# Patient Record
Sex: Female | Born: 1970
Health system: Southern US, Community
[De-identification: ages and names within clinical notes are randomized; demographics above are authoritative.]

## PROBLEM LIST (undated history)

## (undated) DIAGNOSIS — F419 Anxiety disorder, unspecified: Secondary | ICD-10-CM

## (undated) DIAGNOSIS — M1990 Unspecified osteoarthritis, unspecified site: Secondary | ICD-10-CM

## (undated) DIAGNOSIS — I10 Essential (primary) hypertension: Secondary | ICD-10-CM

## (undated) DIAGNOSIS — D45 Polycythemia vera: Secondary | ICD-10-CM

## (undated) HISTORY — DX: Anxiety disorder, unspecified: F41.9

## (undated) HISTORY — PX: BREAST SURGERY: SHX581

## (undated) HISTORY — DX: Unspecified osteoarthritis, unspecified site: M19.90

---

## 2015-12-25 ENCOUNTER — Encounter (HOSPITAL_BASED_OUTPATIENT_CLINIC_OR_DEPARTMENT_OTHER): Payer: Self-pay | Admitting: *Deleted

## 2015-12-25 ENCOUNTER — Emergency Department (HOSPITAL_BASED_OUTPATIENT_CLINIC_OR_DEPARTMENT_OTHER)
Admission: EM | Admit: 2015-12-25 | Discharge: 2015-12-25 | Disposition: A | Discharge status: Home / Self Care | Payer: Self-pay | Attending: Emergency Medicine | Admitting: Emergency Medicine

## 2015-12-25 ENCOUNTER — Emergency Department (HOSPITAL_BASED_OUTPATIENT_CLINIC_OR_DEPARTMENT_OTHER): Payer: Self-pay

## 2015-12-25 DIAGNOSIS — I10 Essential (primary) hypertension: Secondary | ICD-10-CM | POA: Insufficient documentation

## 2015-12-25 HISTORY — DX: Polycythemia vera: D45

## 2015-12-25 HISTORY — DX: Essential (primary) hypertension: I10

## 2015-12-25 LAB — COMPREHENSIVE METABOLIC PANEL
ALBUMIN: 4.7 g/dL (ref 3.5–5.0)
ALK PHOS: 70 U/L (ref 38–126)
ALT: 30 U/L (ref 14–54)
ANION GAP: 14 (ref 5–15)
AST: 28 U/L (ref 15–41)
BUN: 9 mg/dL (ref 6–20)
CALCIUM: 9.6 mg/dL (ref 8.9–10.3)
CO2: 24 mmol/L (ref 22–32)
Chloride: 98 mmol/L — ABNORMAL LOW (ref 101–111)
Creatinine, Ser: 0.65 mg/dL (ref 0.44–1.00)
GFR calc non Af Amer: >60 mL/min (ref >60)
Glucose, Bld: 117 mg/dL — ABNORMAL HIGH (ref 65–99)
Potassium: 3.6 mmol/L (ref 3.5–5.1)
SODIUM: 136 mmol/L (ref 135–145)
TOTAL PROTEIN: 7.9 g/dL (ref 6.5–8.1)
Total Bilirubin: 1 mg/dL (ref 0.3–1.2)

## 2015-12-25 LAB — CBC
HCT: 46.4 % — ABNORMAL HIGH (ref 36.0–46.0)
HEMOGLOBIN: 16.4 g/dL — AB (ref 12.0–15.0)
MCH: 32.2 pg (ref 26.0–34.0)
MCHC: 35.3 g/dL (ref 30.0–36.0)
MCV: 91.2 fL (ref 78.0–100.0)
Platelets: 335 10*3/uL (ref 150–400)
RBC: 5.09 MIL/uL (ref 3.87–5.11)
RDW: 12.5 % (ref 11.5–15.5)
WBC: 10.1 10*3/uL (ref 4.0–10.5)

## 2015-12-25 LAB — TROPONIN I
TROPONIN I: 0.06 ng/mL — AB (ref <0.031)
Troponin I: <0.03 ng/mL (ref <0.031)

## 2015-12-25 MED ORDER — LISINOPRIL 10 MG PO TABS: 10.0000 mg | ORAL_TABLET | Freq: Every day | ORAL | Status: DC

## 2015-12-25 MED FILL — LISINOPRIL 10 MG TABLET: 10 | 14 days supply | Qty: 14 | Fill #0

## 2015-12-25 NOTE — Discharge Instructions (Signed)
Hypertension Hypertension, commonly called high blood pressure, is when the force of blood pumping through your arteries is too strong. Your arteries are the blood vessels that carry blood from your heart throughout your body. A blood pressure reading consists of a higher number over a lower number, such as 110/72. The higher number (systolic) is the pressure inside your arteries when your heart pumps. The lower number (diastolic) is the pressure inside your arteries when your heart relaxes. Ideally you want your blood pressure below 120/80. Hypertension forces your heart to work harder to pump blood. Your arteries may become narrow or stiff. Having untreated or uncontrolled hypertension can cause heart attack, stroke, kidney disease, and other problems. RISK FACTORS Some risk factors for high blood pressure are controllable. Others are not.  Risk factors you cannot control include:   Race. You may be at higher risk if you are African American.  Age. Risk increases with age.  Gender. Men are at higher risk than women before age 45 years. After age 65, women are at higher risk than men. Risk factors you can control include:  Not getting enough exercise or physical activity.  Being overweight.  Getting too much fat, sugar, calories, or salt in your diet.  Drinking too much alcohol. SIGNS AND SYMPTOMS Hypertension does not usually cause signs or symptoms. Extremely high blood pressure (hypertensive crisis) may cause headache, anxiety, shortness of breath, and nosebleed. DIAGNOSIS To check if you have hypertension, your health care provider will measure your blood pressure while you are seated, with your arm held at the level of your heart. It should be measured at least twice using the same arm. Certain conditions can cause a difference in blood pressure between your right and left arms. A blood pressure reading that is higher than normal on one occasion does not mean that you need treatment. If  it is not clear whether you have high blood pressure, you may be asked to return on a different day to have your blood pressure checked again. Or, you may be asked to monitor your blood pressure at home for 1 or more weeks. TREATMENT Treating high blood pressure includes making lifestyle changes and possibly taking medicine. Living a healthy lifestyle can help lower high blood pressure. You may need to change some of your habits. Lifestyle changes may include:  Following the DASH diet. This diet is high in fruits, vegetables, and whole grains. It is low in salt, red meat, and added sugars.  Keep your sodium intake below 2,300 mg per day.  Getting at least 30-45 minutes of aerobic exercise at least 4 times per week.  Losing weight if necessary.  Not smoking.  Limiting alcoholic beverages.  Learning ways to reduce stress. Your health care provider may prescribe medicine if lifestyle changes are not enough to get your blood pressure under control, and if one of the following is true:  You are 18-59 years of age and your systolic blood pressure is above 140.  You are 60 years of age or older, and your systolic blood pressure is above 150.  Your diastolic blood pressure is above 90.  You have diabetes, and your systolic blood pressure is over 140 or your diastolic blood pressure is over 90.  You have kidney disease and your blood pressure is above 140/90.  You have heart disease and your blood pressure is above 140/90. Your personal target blood pressure may vary depending on your medical conditions, your age, and other factors. HOME CARE INSTRUCTIONS    Have your blood pressure rechecked as directed by your health care provider.   Take medicines only as directed by your health care provider. Follow the directions carefully. Blood pressure medicines must be taken as prescribed. The medicine does not work as well when you skip doses. Skipping doses also puts you at risk for  problems.  Do not smoke.   Monitor your blood pressure at home as directed by your health care provider. SEEK MEDICAL CARE IF:   You think you are having a reaction to medicines taken.  You have recurrent headaches or feel dizzy.  You have swelling in your ankles.  You have trouble with your vision. SEEK IMMEDIATE MEDICAL CARE IF:  You develop a severe headache or confusion.  You have unusual weakness, numbness, or feel faint.  You have severe chest or abdominal pain.  You vomit repeatedly.  You have trouble breathing. MAKE SURE YOU:   Understand these instructions.  Will watch your condition.  Will get help right away if you are not doing well or get worse.   This information is not intended to replace advice given to you by your health care provider. Make sure you discuss any questions you have with your health care provider.   Document Released: 07/08/2005 Document Revised: 11/22/2014 Document Reviewed: 04/30/2013 Elsevier Interactive Patient Education 2016 Else[employer] Inc.  

## 2015-12-25 NOTE — ED Notes (Signed)
C/o eye twitching x 2 weeks. C/o high blood pressure and feels pressure and tingling in left arm and pressure in  Left upper chest. Pt c/o anxiety and probems with recent move. Pt is very anxious. Pt c/o h/a frontal.

## 2015-12-25 NOTE — ED Provider Notes (Signed)
CSN: [id number]     Arrival date & time 12/25/15  1135 History   First MD Initiated Contact with Patient 12/25/15 1211     Chief Complaint  Patient presents with  . Hypertension   HPI Pt has a history of having high blood pressure over the past few years.  She does not have a primary care doctor and has not had it evaluated further.  Over the last several days she has had some intermittent chest pain.  She thought it may be related to stress over moving to a new apartment.  A neighbor who reportedly has some mental health issues is causing a lot of problems.  The police have been involved.  The discomfort is vague in the left chest.  Constant last day or two. She has some tingling in the left arm too.  The sx get worse after the stress from the neighbor.    She has managed to lose weight , almost 50 lbs in the last year.  She exercises regularly and uses a gym.  She does not have chest pain with exercise. Past Medical History  Diagnosis Date  . Hypertension   . Polycythemia vera ([place])    History reviewed. No pertinent past surgical history. No family history on file. Social History  Substance Use Topics  . Smoking status: Never Smoker   . Smokeless tobacco: None  . Alcohol Use: None   OB History    No data available     Review of Systems  All other systems reviewed and are negative.     Allergies  Penicillins  Home Medications   Prior to Admission medications   Not on File   BP 147/101 mmHg  Pulse 78  Temp(Src) 98.2 F (36.8 C) (Oral)  Resp 23  Ht 5' 4.5" (1.638 m)  Wt 72.576 kg  BMI 27.05 kg/m2  SpO2 95% Physical Exam  Constitutional: No distress.  HENT:  Head: Normocephalic and atraumatic.  Right Ear: External ear normal.  Left Ear: External ear normal.  Eyes: Conjunctivae are normal. Right eye exhibits no discharge. Left eye exhibits no discharge. No scleral icterus.  Neck: Neck supple. No tracheal deviation present.  Cardiovascular: Normal rate, regular  rhythm and intact distal pulses.   Pulmonary/Chest: Effort normal and breath sounds normal. No stridor. No respiratory distress. She has no wheezes. She has no rales.  Abdominal: Soft. Bowel sounds are normal. She exhibits no distension. There is no tenderness. There is no rebound and no guarding.  Musculoskeletal: She exhibits no edema or tenderness.  Neurological: She is alert. She has normal strength. No cranial nerve deficit (no facial droop, extraocular movements intact, no slurred speech) or sensory deficit. She exhibits normal muscle tone. She displays no seizure activity. Coordination normal.  Skin: Skin is warm and dry. No rash noted.  Psychiatric: She has a normal mood and affect.  Nursing note and vitals reviewed.   ED Course  Procedures (including critical care time) Labs Review Labs Reviewed  CBC - Abnormal; Notable for the following:    Hemoglobin 16.4 (*)    HCT 46.4 (*)    All other components within normal limits  COMPREHENSIVE METABOLIC PANEL - Abnormal; Notable for the following:    Chloride 98 (*)    Glucose, Bld 117 (*)    All other components within normal limits  TROPONIN I - Abnormal; Notable for the following:    Troponin I 0.06 (*)    All other components within normal limits  TROPONIN I    Imaging Review Dg Chest 2 View  12/25/2015  CLINICAL DATA:  Left chest pain for 2 days.  Hypertension. EXAM: CHEST  2 VIEW COMPARISON:  None. FINDINGS: Heart and mediastinal contours are within normal limits. No focal opacities or effusions. No acute bony abnormality. IMPRESSION: No active cardiopulmonary disease. Electronically Signed   By: Rolm Baptise M.D.   On: 12/25/2015 12:55   I have personally reviewed and evaluated these images and lab results as part of my medical decision-making.   EKG Interpretation   Date/Time:  Monday December 25 2015 11:48:13 EDT Ventricular Rate:  89 PR Interval:  141 QRS Duration: 87 QT Interval:  368 QTC Calculation: 448 R Axis:    20 Text Interpretation:  Sinus rhythm Baseline wander in lead(s) V5 No  previous tracing Confirmed by Lucill Mauck  MD-J, Ravenna Legore ([id number]) on 12/25/2015  11:58:26 AM      MDM   Final diagnoses:  Essential hypertension    1343  Initial heart enzyme slightly elevated.  Will repeat in 1 hr.   1531  Repeat trop is normal.  Doubt that first enzyme was accurate.  Heart score of 1 for family history of heart disease in her mother.  Low risk for ACS.  Overall doubt cardiac etiology. Will dc home on an ace inhibitor. Follow up with a primary care doctor.  Discussed importance of management of her blood pressure.    Dorie Rank, MD 12/25/15 1534

## 2015-12-25 NOTE — ED Notes (Signed)
Dr. Tomi Bamberger notified of pt's troponin 0.06

## 2015-12-25 NOTE — ED Notes (Signed)
MD at bedside. 

## 2016-03-20 MED FILL — LISINOPRIL 10 MG TABLET: 10 | 14 days supply | Qty: 14 | Fill #1

## 2017-10-01 ENCOUNTER — Other Ambulatory Visit: Payer: Self-pay | Admitting: Family Medicine

## 2017-10-01 DIAGNOSIS — R928 Other abnormal and inconclusive findings on diagnostic imaging of breast: Secondary | ICD-10-CM

## 2017-10-03 ENCOUNTER — Ambulatory Visit
Admission: RE | Admit: 2017-10-03 | Discharge: 2017-10-03 | Disposition: A | Discharge status: Home / Self Care | Payer: Managed Care, Other (non HMO) | Source: Ambulatory Visit | Attending: Family Medicine | Admitting: Family Medicine

## 2017-10-03 ENCOUNTER — Encounter (INDEPENDENT_AMBULATORY_CARE_PROVIDER_SITE_OTHER): Payer: Self-pay

## 2017-10-03 DIAGNOSIS — R928 Other abnormal and inconclusive findings on diagnostic imaging of breast: Secondary | ICD-10-CM

## 2017-10-03 HISTORY — PX: BREAST EXCISIONAL BIOPSY: SUR124

## 2017-11-08 ENCOUNTER — Emergency Department (HOSPITAL_BASED_OUTPATIENT_CLINIC_OR_DEPARTMENT_OTHER)
Admission: EM | Admit: 2017-11-08 | Discharge: 2017-11-08 | Disposition: A | Discharge status: Home / Self Care | Payer: Managed Care, Other (non HMO) | Attending: Emergency Medicine | Admitting: Emergency Medicine

## 2017-11-08 ENCOUNTER — Encounter (HOSPITAL_BASED_OUTPATIENT_CLINIC_OR_DEPARTMENT_OTHER): Payer: Self-pay | Admitting: Emergency Medicine

## 2017-11-08 ENCOUNTER — Other Ambulatory Visit: Payer: Self-pay

## 2017-11-08 DIAGNOSIS — R21 Rash and other nonspecific skin eruption: Secondary | ICD-10-CM

## 2017-11-08 DIAGNOSIS — I10 Essential (primary) hypertension: Secondary | ICD-10-CM | POA: Diagnosis not present

## 2017-11-08 DIAGNOSIS — L259 Unspecified contact dermatitis, unspecified cause: Secondary | ICD-10-CM | POA: Diagnosis not present

## 2017-11-08 DIAGNOSIS — Z79899 Other long term (current) drug therapy: Secondary | ICD-10-CM | POA: Diagnosis not present

## 2017-11-08 MED ORDER — FAMOTIDINE 20 MG PO TABS: 20.0000 mg | ORAL_TABLET | Freq: Two times a day (BID) | ORAL | 0 refills | Status: DC

## 2017-11-08 MED ORDER — HYDROCORTISONE 2.5 % EX LOTN: TOPICAL_LOTION | Freq: Two times a day (BID) | CUTANEOUS | 0 refills | Status: DC

## 2017-11-08 MED ORDER — CEPHALEXIN 500 MG PO CAPS: 500.0000 mg | ORAL_CAPSULE | Freq: Four times a day (QID) | ORAL | 0 refills | Status: DC

## 2017-11-08 NOTE — Discharge Instructions (Signed)
Continue taking Benadryl as prescribed over-the-counter every 6 hours.  Take Pepcid twice daily as prescribed.  Apply hydrocortisone cream twice daily.  Take Keflex 4 times daily for 5 days.  This will cover for any infection beginning along your bra line.  Please follow-up with your doctor early next week for recheck.  Please return to emergency department if you develop any new or worsening symptoms including swelling of the lips, tongue, throat, blistering of your rash, drainage, pain, or any other new or concerning symptom.

## 2017-11-08 NOTE — ED Provider Notes (Signed)
[facility] EMERGENCY DEPARTMENT Provider Note   CSN: [id number] Arrival date & time: 11/08/17  0945     History   Chief Complaint Chief Complaint  Patient presents with  . Rash    HPI Cindy Cochran is a 47 y.o. female with recent lumpectomy 5 days ago who presents with a rash that began the day after the lumpectomy.  The rash is itching and burning.  It is located on her breasts and under her bra.  The area under the bra is painful.  She denies any fevers, swelling of her lips, tongue, throat, shortness of breath.  Her surgical incision has been healing well.  No drainage.  Patient reports having a similar rash that lasted 3 weeks after her breast biopsy.  She reports using Benadryl and hydrocortisone cream at home without significant relief.  HPI  Past Medical History:  Diagnosis Date  . Hypertension   . Polycythemia vera ([place])     There are no active problems to display for this patient.   History reviewed. No pertinent surgical history.   OB History   None      Home Medications    Prior to Admission medications   Medication Sig Start Date End Date Taking? Authorizing Provider  cephALEXin (KEFLEX) 500 MG capsule Take 1 capsule (500 mg total) by mouth 4 (four) times daily. 11/08/17   Aislee Landgren, Bea Graff, PA-C  famotidine (PEPCID) 20 MG tablet Take 1 tablet (20 mg total) by mouth 2 (two) times daily. 11/08/17   Raylea Adcox, Bea Graff, PA-C  hydrocortisone 2.5 % lotion Apply topically 2 (two) times daily. 11/08/17   Race Latour, Bea Graff, PA-C  lisinopril (PRINIVIL,ZESTRIL) 10 MG tablet Take 1 tablet (10 mg total) by mouth daily. 12/25/15   Dorie Rank, MD    Family History History reviewed. No pertinent family history.  Social History Social History   Tobacco Use  . Smoking status: Never Smoker  . Smokeless tobacco: Never Used  Substance Use Topics  . Alcohol use: Not on file  . Drug use: Not on file     Allergies   Penicillins   Review of Systems Review of  Systems  Constitutional: Negative for fever.  HENT: Negative for facial swelling and trouble swallowing.   Respiratory: Negative for shortness of breath.   Skin: Positive for rash.     Physical Exam Updated Vital Signs BP (!) 144/92 (BP Location: Left Arm)   Pulse 80   Temp 98.3 F (36.8 C) (Oral)   Resp 18   Ht 5\' 4"  (1.626 m)   Wt 81.6 kg (180 lb)   SpO2 98%   BMI 30.90 kg/m   Physical Exam  Constitutional: She appears well-developed and well-nourished. No distress.  HENT:  Head: Normocephalic and atraumatic.  Mouth/Throat: Oropharynx is clear and moist. No oropharyngeal exudate.  Eyes: Pupils are equal, round, and reactive to light. Conjunctivae are normal. Right eye exhibits no discharge. Left eye exhibits no discharge. No scleral icterus.  Neck: Normal range of motion. Neck supple. No thyromegaly present.  Cardiovascular: Normal rate, regular rhythm, normal heart sounds and intact distal pulses. Exam reveals no gallop and no friction rub.  No murmur heard. Pulmonary/Chest: Effort normal and breath sounds normal. No stridor. No respiratory distress. She has no wheezes. She has no rales.  Erythematous rash with reticular pattern, some satellite lesions, solid erythema under the left breast which is tender    Abdominal: Soft. Bowel sounds are normal. She exhibits no distension. There is  no tenderness. There is no rebound and no guarding.  Musculoskeletal: She exhibits no edema.  Lymphadenopathy:    She has no cervical adenopathy.  Neurological: She is alert. Coordination normal.  Skin: Skin is warm and dry. No rash noted. She is not diaphoretic. No pallor.  Psychiatric: She has a normal mood and affect.  Nursing note and vitals reviewed.    ED Treatments / Results  Labs (all labs ordered are listed, but only abnormal results are displayed) Labs Reviewed - No data to display  EKG None  Radiology No results found.  Procedures Procedures (including critical  care time)  Medications Ordered in ED Medications - No data to display   Initial Impression / Assessment and Plan / ED Course  I have reviewed the triage vital signs and the nursing notes.  Pertinent labs & imaging results that were available during my care of the patient were reviewed by me and considered in my medical decision making (see chart for details).     Patient with suspected contact dermatitis with possible early infection under the breast considering tenderness and warmth.  Will cover with Keflex and add Pepcid and hydrocortisone 2.5% to Benadryl.  Follow-up to surgeon in 1-2 days for recheck.  Return precautions discussed.  Patient understands and agrees with plan.  Patient vitals stable throughout ED course and discharged in satisfactory condition.  Final Clinical Impressions(s) / ED Diagnoses   Final diagnoses:  Rash and nonspecific skin eruption  Contact dermatitis, unspecified contact dermatitis type, unspecified trigger    ED Discharge Orders        Ordered    famotidine (PEPCID) 20 MG tablet  2 times daily     11/08/17 1033    hydrocortisone 2.5 % lotion  2 times daily     11/08/17 1034    cephALEXin (KEFLEX) 500 MG capsule  4 times daily     11/08/17 [address], [place] 11/08/17 1537    Cardama, Grayce Sessions, MD 11/08/17 1758

## 2017-11-08 NOTE — ED Triage Notes (Signed)
Patient had a lumpectomy on tues. The patient has a rash to her chest. Denies any Chest pain or SOB - patient states that she took benadryl

## 2018-07-22 HISTORY — PX: BREAST BIOPSY: SHX20

## 2019-07-20 ENCOUNTER — Other Ambulatory Visit: Payer: Self-pay

## 2019-07-20 ENCOUNTER — Emergency Department (INDEPENDENT_AMBULATORY_CARE_PROVIDER_SITE_OTHER)
Admission: EM | Admit: 2019-07-20 | Discharge: 2019-07-20 | Disposition: A | Discharge status: Home / Self Care | Payer: 59 | Source: Home / Self Care | Attending: Family Medicine | Admitting: Family Medicine

## 2019-07-20 DIAGNOSIS — Z20828 Contact with and (suspected) exposure to other viral communicable diseases: Secondary | ICD-10-CM | POA: Diagnosis not present

## 2019-07-20 DIAGNOSIS — Z20822 Contact with and (suspected) exposure to covid-19: Secondary | ICD-10-CM

## 2019-07-20 NOTE — ED Provider Notes (Signed)
[facility]    CSN: [id number] Arrival date & time: 07/20/19  1333      History   Chief Complaint Chief Complaint  Patient presents with  . exposure to covid    HPI Cindy Cochran is a 48 y.o. female.   Patient has had exposure to family member with [place] and desires testing.  She is completely assymptomatic at present.     Past Medical History:  Diagnosis Date  . Hypertension   . Polycythemia vera ([place])     There are no problems to display for this patient.   Past Surgical History:  Procedure Laterality Date  . BREAST SURGERY    . CESAREAN SECTION      OB History   No obstetric history on file.      Home Medications    Prior to Admission medications   Medication Sig Start Date End Date Taking? Authorizing Provider  cephALEXin (KEFLEX) 500 MG capsule Take 1 capsule (500 mg total) by mouth 4 (four) times daily. 11/08/17   Law, Bea Graff, PA-C  famotidine (PEPCID) 20 MG tablet Take 1 tablet (20 mg total) by mouth 2 (two) times daily. 11/08/17   Law, Bea Graff, PA-C  hydrocortisone 2.5 % lotion Apply topically 2 (two) times daily. 11/08/17   Law, Bea Graff, PA-C  lisinopril (PRINIVIL,ZESTRIL) 10 MG tablet Take 1 tablet (10 mg total) by mouth daily. 12/25/15   Dorie Rank, MD    Family History Family History  Problem Relation Age of Onset  . Cancer Mother   . Diabetes Father   . Cancer Father     Social History Social History   Tobacco Use  . Smoking status: Current Every Day Smoker    Packs/day: 0.50    Types: Cigarettes  . Smokeless tobacco: Never Used  Substance Use Topics  . Alcohol use: Yes    Comment: 2 bottles a week  . Drug use: Not Currently     Allergies   Penicillins   Review of Systems Review of Systems No sore throat No cough No pleuritic pain No wheezing No nasal congestion No post-nasal drainage No sinus pain/pressure No itchy/red eyes No earache No hemoptysis No SOB No fever/chills No nausea No  vomiting No abdominal pain No diarrhea No urinary symptoms No skin rash No fatigue No myalgias No headache   Physical Exam Triage Vital Signs ED Triage Vitals  Enc Vitals Group     BP 07/20/19 1348 (!) 140/100     Pulse Rate 07/20/19 1348 84     Resp 07/20/19 1348 20     Temp 07/20/19 1348 98.5 F (36.9 C)     Temp Source 07/20/19 1348 Oral     SpO2 07/20/19 1348 94 %     Weight 07/20/19 1350 200 lb (90.7 kg)     Height 07/20/19 1350 5\' 4"  (1.626 m)     Head Circumference --      Peak Flow --      Pain Score 07/20/19 1349 0     Pain Loc --      Pain Edu? --      Excl. in [place]? --    No data found.  Updated Vital Signs BP (!) 140/100 (BP Location: Right Arm)   Pulse 84   Temp 98.5 F (36.9 C) (Oral)   Resp 20   Ht 5\' 4"  (1.626 m)   Wt 90.7 kg   SpO2 94%   BMI 34.33 kg/m   Visual Acuity Right  Eye Distance:   Left Eye Distance:   Bilateral Distance:    Right Eye Near:   Left Eye Near:    Bilateral Near:     Physical Exam Vitals reviewed.  Constitutional:      General: She is not in acute distress. Neurological:     Mental Status: She is alert.   Patient not examined otherwise.   UC Treatments / Results  Labs (all labs ordered are listed, but only abnormal results are displayed) Labs Reviewed  SARS-COV-2 RNA, QUALITATIVE REAL-TIME RT-PCR    EKG   Radiology No results found.  Procedures Procedures (including critical care time)  Medications Ordered in UC Medications - No data to display  Initial Impression / Assessment and Plan / UC Course  I have reviewed the triage vital signs and the nursing notes.  Pertinent labs & imaging results that were available during my care of the patient were reviewed by me and considered in my medical decision making (see chart for details).    Patient assymptomatic at present. COVID19 send out   Final Clinical Impressions(s) / UC Diagnoses   Final diagnoses:  Close exposure to COVID-19 virus      Discharge Instructions     Isolate yourself until COVID-19 test result is available.   If your COVID19 test is positive, then you are infected with the novel coronavirus and could give the virus to others.  Please continue isolation at home for at least 10 days since the start of your symptoms. If you do not have symptoms, please isolate at home for 10 days from the day you were tested. Once you complete your 10 day quarantine, you may return to normal activities as long as you've not had a fever for over 24 hours (without taking fever reducing medicine) and your symptoms are improving. Please continue good preventive care measures, including:  frequent hand-washing, avoid touching your face, cover coughs/sneezes, stay out of crowds and keep a 6 foot distance from others.  Go to the nearest hospital emergency room if fever/cough/breathlessness are severe or illness seems like a threat to life.     ED Prescriptions    None        Kandra Nicolas, MD 07/22/19 2256

## 2019-07-20 NOTE — Discharge Instructions (Addendum)
Isolate yourself until COVID-19 test result is available.   If your COVID19 test is positive, then you are infected with the novel coronavirus and could give the virus to others.  Please continue isolation at home for at least 10 days since the start of your symptoms. If you do not have symptoms, please isolate at home for 10 days from the day you were tested. Once you complete your 10 day quarantine, you may return to normal activities as long as you've not had a fever for over 24 hours (without taking fever reducing medicine) and your symptoms are improving. Please continue good preventive care measures, including:  frequent hand-washing, avoid touching your face, cover coughs/sneezes, stay out of crowds and keep a 6 foot distance from others.  Go to the nearest hospital emergency room if fever/cough/breathlessness are severe or illness seems like a threat to life.  

## 2019-07-20 NOTE — ED Triage Notes (Signed)
Pt was at daughters wed-Sat last weekend. Son in law tested positive today.

## 2019-07-22 LAB — SARS-COV-2 RNA,(COVID-19) QUALITATIVE NAAT: SARS CoV2 RNA: NOT DETECTED

## 2021-07-18 ENCOUNTER — Other Ambulatory Visit: Payer: Self-pay | Admitting: General Surgery

## 2021-07-18 DIAGNOSIS — Z1231 Encounter for screening mammogram for malignant neoplasm of breast: Secondary | ICD-10-CM

## 2021-07-25 ENCOUNTER — Other Ambulatory Visit: Payer: Self-pay

## 2021-07-25 ENCOUNTER — Ambulatory Visit
Admission: RE | Admit: 2021-07-25 | Discharge: 2021-07-25 | Disposition: A | Discharge status: Home / Self Care | Payer: BC Managed Care – PPO | Source: Ambulatory Visit

## 2021-07-25 DIAGNOSIS — Z1231 Encounter for screening mammogram for malignant neoplasm of breast: Secondary | ICD-10-CM

## 2021-10-29 DIAGNOSIS — M79675 Pain in left toe(s): Secondary | ICD-10-CM | POA: Diagnosis not present

## 2021-10-29 DIAGNOSIS — S92415A Nondisplaced fracture of proximal phalanx of left great toe, initial encounter for closed fracture: Secondary | ICD-10-CM | POA: Diagnosis not present

## 2021-11-01 DIAGNOSIS — S92415A Nondisplaced fracture of proximal phalanx of left great toe, initial encounter for closed fracture: Secondary | ICD-10-CM | POA: Diagnosis not present

## 2022-06-25 DIAGNOSIS — I10 Essential (primary) hypertension: Secondary | ICD-10-CM | POA: Diagnosis not present

## 2022-06-25 DIAGNOSIS — J209 Acute bronchitis, unspecified: Secondary | ICD-10-CM | POA: Diagnosis not present

## 2022-07-03 ENCOUNTER — Encounter: Payer: Self-pay | Admitting: Family Medicine

## 2022-07-03 ENCOUNTER — Ambulatory Visit: Payer: BC Managed Care – PPO | Admitting: Family Medicine

## 2022-07-03 VITALS — BP 136/82 | HR 90 | Temp 97.5°F | Ht 64.0 in | Wt 150.2 lb

## 2022-07-03 DIAGNOSIS — D45 Polycythemia vera: Secondary | ICD-10-CM | POA: Diagnosis not present

## 2022-07-03 DIAGNOSIS — F17200 Nicotine dependence, unspecified, uncomplicated: Secondary | ICD-10-CM | POA: Diagnosis not present

## 2022-07-03 DIAGNOSIS — I10 Essential (primary) hypertension: Secondary | ICD-10-CM | POA: Insufficient documentation

## 2022-07-03 MED ORDER — VARENICLINE TARTRATE 1 MG PO TABS: ORAL_TABLET | ORAL | 0 refills | Status: AC

## 2022-07-03 MED ORDER — VARENICLINE TARTRATE (STARTER) 0.5 MG X 11 & 1 MG X 42 PO TBPK: ORAL_TABLET | ORAL | 0 refills | Status: DC

## 2022-07-03 MED ORDER — NICOTINE 21 MG/24HR TD PT24: 21.0000 mg | MEDICATED_PATCH | Freq: Every day | TRANSDERMAL | 0 refills | Status: DC

## 2022-07-03 NOTE — Progress Notes (Signed)
Assessment/Plan:   Problem List Items Addressed This Visit       Cardiovascular and Mediastinum   Primary hypertension    Mildly elevated blood pressure through the course of recent illness and while on steroids Normotensive today Continue monitor home blood pressures Return in 1 month for recheck        Other   Tobacco use disorder    Trial Chantix and nicotine patches      Relevant Medications   nicotine (NICODERM CQ - DOSED IN MG/24 HOURS) 21 mg/24hr patch   varenicline (CHANTIX) 1 MG tablet   Other Relevant Orders   Ambulatory referral to Smoking Cessation Program   Polycythemia vera ([place]) - Primary       Subjective:  HPI:  Cindy Cochran is a 51 y.o. female who has Primary hypertension; Tobacco use disorder; and Polycythemia vera ([place]) on their problem list..   She  has a past medical history of Anxiety, Arthritis (in foot and left hand), Hypertension, and Polycythemia vera ([place])..   She presents with chief complaint of Establish Care (148/103 B/p at [facility] visit. Concerned about b/p. Patient would like to discuss colonoscopy options ) .  Chart review of labs in 2019 showed  lipid panel with total cholesterol of 262, HDL 67 LDL 168,  BMP was grossly normal CBC with hemoglobin 15.8 remainder normal TSH 1.75  Acute Bronchitis. Patient recently course of bronchitis. It started as a "cold" after thanksgiving. It worsened until patient went to UC about 1 week ago.  She was treated with a z-pack, albuterol inhaler, steroid course, which she has completed.  Patient reports significant improvement in breathing symptoms.  She does have some mild fatigue and dyspnea.  Denies any chest pain or ongoing cough.  Denies any fevers.  Reports resolution of breathing symptoms. Had high blood pressure at UC. Has had hypertension in past, but had significant improvement s/p 50 lbs.   Hypertension.  Patient has history of hypertension.  However she had significant improvement in blood  pressure when she lost approximately 50 pounds several years ago.  At recent urgent care and it would improve it always improves visit patient was found to have mildly elevated blood pressure at 148/103.  Patient has been checking her blood pressure at home and is range in the 120s - 160s / 80s-104.  Patient has a lower extremity swelling or headache.  Polycythemia Vera.  Patient reports a history of positive variant.  She has previously seen Hem/Onc, but not lost to follow up.  Patient reports that she can sometimes get blue in her lower extremities.  Denies any easy bruising.  CBC in 2020 had hemoglobin of 15.8, remainder of blood lines were normal.  Tobacco use.  She is interested in quitting, has tried in the past, has tried vaping, welbutrin, and cold [place], but she has returned smoking.   Past Surgical History:  Procedure Laterality Date   BREAST SURGERY     CESAREAN SECTION      Outpatient Medications Prior to Visit  Medication Sig Dispense Refill   cephALEXin (KEFLEX) 500 MG capsule Take 1 capsule (500 mg total) by mouth 4 (four) times daily. (Patient not taking: Reported on 07/03/2022) 20 capsule 0   famotidine (PEPCID) 20 MG tablet Take 1 tablet (20 mg total) by mouth 2 (two) times daily. (Patient not taking: Reported on 07/03/2022) 30 tablet 0   hydrocortisone 2.5 % lotion Apply topically 2 (two) times daily. (Patient not taking: Reported on 07/03/2022) 59  mL 0   lisinopril (PRINIVIL,ZESTRIL) 10 MG tablet Take 1 tablet (10 mg total) by mouth daily. (Patient not taking: Reported on 07/03/2022) 14 tablet 1   No facility-administered medications prior to visit.    Family History  Problem Relation Age of Onset   Cancer Mother    Heart disease Mother    Miscarriages / Stillbirths Mother    Obesity Mother    Diabetes Father    Cancer Father     Social History   Socioeconomic History   Marital status: Married    Spouse name: Not on file   Number of children: Not on file    Years of education: Not on file   Highest education level: Not on file  Occupational History   Not on file  Tobacco Use   Smoking status: Every Day    Packs/day: 0.50    Years: 30.00    Total pack years: 15.00    Types: Cigarettes    Passive exposure: Never   Smokeless tobacco: Never  Vaping Use   Vaping Use: Never used  Substance and Sexual Activity   Alcohol use: Not Currently    Comment: 2 bottles a week   Drug use: Never   Sexual activity: Yes    Birth control/protection: None    Comment: Hysterectomy  Other Topics Concern   Not on file  Social History Narrative   Not on file   Social Determinants of Health   Financial Resource Strain: Not on file  Food Insecurity: Not on file  Transportation Needs: Not on file  Physical Activity: Not on file  Stress: Not on file  Social Connections: Not on file  Intimate Partner Violence: Not on file                                                                                                 Objective:  Physical Exam: BP 136/82 (BP Location: Left Arm, Patient Position: Sitting, Cuff Size: Large)   Pulse 90   Temp (!) 97.5 F (36.4 C) (Temporal)   Ht '5\' 4"'$  (1.626 m)   Wt 150 lb 3.2 oz (68.1 kg)   SpO2 95%   BMI 25.78 kg/m    General: No acute distress. Awake and conversant.  Eyes: Normal conjunctiva, anicteric. Round symmetric pupils.  ENT: Hearing grossly intact. No nasal discharge.  Neck: Neck is supple. No masses or thyromegaly.  Respiratory: Respirations are non-labored.  CTAB Skin: Warm. No rashes or ulcers.  Psych: Alert and oriented. Cooperative, Appropriate mood and affect, Normal judgment.  CV: No cyanosis or JVD, RRR, no MRG MSK: Normal ambulation. No clubbing  Neuro: Sensation and CN II-XII grossly normal.        Alesia Banda, MD, MS

## 2022-07-03 NOTE — Assessment & Plan Note (Signed)
Trial Chantix and nicotine patches

## 2022-07-03 NOTE — Assessment & Plan Note (Signed)
Mildly elevated blood pressure through the course of recent illness and while on steroids Normotensive today Continue monitor home blood pressures Return in 1 month for recheck

## 2022-07-03 NOTE — Patient Instructions (Signed)
For smoking, use chantix and nicotine patches.  For blood pressure, check multiple times  a week and keep a long.

## 2022-07-31 ENCOUNTER — Ambulatory Visit: Payer: BC Managed Care – PPO | Admitting: Family Medicine

## 2022-08-08 ENCOUNTER — Ambulatory Visit: Payer: BC Managed Care – PPO | Admitting: Family Medicine

## 2022-08-08 ENCOUNTER — Telehealth: Payer: Self-pay | Admitting: Family Medicine

## 2022-08-08 NOTE — Telephone Encounter (Signed)
pt called having fever, husband is also sick, not feeling up to visit in person or VV, rescheduled for 1/25   Fee waived, advised pt of no show policy verbally, letter sent via [place]

## 2022-08-15 ENCOUNTER — Encounter: Payer: Self-pay | Admitting: Family Medicine

## 2022-08-15 ENCOUNTER — Ambulatory Visit: Payer: BC Managed Care – PPO | Admitting: Family Medicine

## 2022-08-15 VITALS — BP 130/88 | HR 83 | Temp 98.5°F | Ht 64.0 in | Wt 150.0 lb

## 2022-08-15 DIAGNOSIS — I10 Essential (primary) hypertension: Secondary | ICD-10-CM | POA: Diagnosis not present

## 2022-08-15 DIAGNOSIS — F17200 Nicotine dependence, unspecified, uncomplicated: Secondary | ICD-10-CM | POA: Diagnosis not present

## 2022-08-15 DIAGNOSIS — E785 Hyperlipidemia, unspecified: Secondary | ICD-10-CM | POA: Diagnosis not present

## 2022-08-15 DIAGNOSIS — Z1231 Encounter for screening mammogram for malignant neoplasm of breast: Secondary | ICD-10-CM | POA: Diagnosis not present

## 2022-08-15 NOTE — Assessment & Plan Note (Signed)
Differential diagnosis: Primary (essential) hypertension is the most likely given the elevated blood pressure readings at home and the high-stress lifestyle of the patient. Secondary hypertension is less likely due to the lack of symptoms suggestive of secondary causes like renal artery stenosis or endocrine disorders.  Plan: Encourage the patient to continue monitoring blood pressure at home. Recommend lifestyle modifications, including stress management techniques and regular physical exercise. Discuss the importance of smoking cessation as it can significantly contribute to hypertension and cardiovascular risk--schedule follow-up in 3-6 months to reevaluate blood pressure and potential initiation of antihypertensive therapy if needed.

## 2022-08-15 NOTE — Assessment & Plan Note (Signed)
Rechecking lipid and restratification labs, patient follow-up pending results

## 2022-08-15 NOTE — Assessment & Plan Note (Signed)
Plan: Facilitate the patient starting smoking cessation with Chantix or nicotine patches. Provide counseling on strategies for managing cravings and withdrawal symptoms. Discuss potential interaction with anxiety and monitor for changes in mental status or increase in anxiety-related symptoms. Set a follow-up to assess progress with smoking cessation efforts.

## 2022-08-15 NOTE — Progress Notes (Signed)
Assessment/Plan:   Problem List Items Addressed This Visit       Cardiovascular and Mediastinum   Primary hypertension - Primary    Differential diagnosis: Primary (essential) hypertension is the most likely given the elevated blood pressure readings at home and the high-stress lifestyle of the patient. Secondary hypertension is less likely due to the lack of symptoms suggestive of secondary causes like renal artery stenosis or endocrine disorders.  Plan: Encourage the patient to continue monitoring blood pressure at home. Recommend lifestyle modifications, including stress management techniques and regular physical exercise. Discuss the importance of smoking cessation as it can significantly contribute to hypertension and cardiovascular risk--schedule follow-up in 3-6 months to reevaluate blood pressure and potential initiation of antihypertensive therapy if needed.      Relevant Orders   TSH   Lipid panel   Hemoglobin A1c   Microalbumin / creatinine urine ratio   Urinalysis, Routine w reflex microscopic   CBC with Differential/Platelet   Comprehensive metabolic panel     Other   Tobacco use disorder    Plan: Facilitate the patient starting smoking cessation with Chantix or nicotine patches. Provide counseling on strategies for managing cravings and withdrawal symptoms. Discuss potential interaction with anxiety and monitor for changes in mental status or increase in anxiety-related symptoms. Set a follow-up to assess progress with smoking cessation efforts.      Hyperlipidemia    Rechecking lipid and restratification labs, patient follow-up pending results      Relevant Orders   TSH   Lipid panel   Hemoglobin A1c   Microalbumin / creatinine urine ratio   Urinalysis, Routine w reflex microscopic   CBC with Differential/Platelet   Comprehensive metabolic panel   Other Visit Diagnoses     Encounter for screening mammogram for malignant neoplasm of breast            There are no discontinued medications.    Subjective:  HPI: Encounter date: 08/15/2022  Cindy Cochran is a 52 y.o. female who has Primary hypertension; Tobacco use disorder; Polycythemia vera ([place]); and Hyperlipidemia on their problem list..   She  has a past medical history of Anxiety, Arthritis (in foot and left hand), Hyperlipidemia (08/15/2022), Hypertension, and Polycythemia vera ([place])..   CHIEF COMPLAINT: Patient presents for follow-up on blood pressure management and discusses smoking cessation and stress-related symptoms.  HISTORY OF PRESENT ILLNESS:  Problem 1: The patient has been monitoring blood pressure at home, reporting readings in the one forties systolic. Blood pressure was initially high at the clinic but settled to normal levels during the visit. The patient denies experiencing any chest pain but reports occasional shortness of breath, which may be related to anxiety given a high-stress job and family responsibilities.  The patient lives with a husband and a dog, and has a quiet household environment.  Problem 2: The patient has not yet started smoking cessation with Chantix or patches due to issues with prescription pickup but is preparing to initiate the process.  REVIEW OF SYSTEMS: The patient reports occasional shortness of breath which may be anxiety or stress-related. Denies chest pain. No other pertinent positives or negatives reported.     07/03/2022    3:26 PM  Depression screen PHQ 2/9  Decreased Interest 0  Down, Depressed, Hopeless 0  PHQ - 2 Score 0  Altered sleeping 0  Tired, decreased energy 0  Change in appetite 0  Feeling bad or failure about yourself  0  Trouble concentrating 0  Moving slowly  or fidgety/restless 0  Suicidal thoughts 0  PHQ-9 Score 0  Difficult doing work/chores Not difficult at all      07/03/2022    3:26 PM  GAD 7 : Generalized Anxiety Score  Nervous, Anxious, on Edge 1  Control/stop worrying 0  Worry too much  - different things 1  Trouble relaxing 1  Restless 0  Easily annoyed or irritable 0  Afraid - awful might happen 0  Total GAD 7 Score 3  Anxiety Difficulty Somewhat difficult     Past Surgical History:  Procedure Laterality Date   BREAST SURGERY     CESAREAN SECTION      Outpatient Medications Prior to Visit  Medication Sig Dispense Refill   nicotine (NICODERM CQ - DOSED IN MG/24 HOURS) 21 mg/24hr patch Place 1 patch (21 mg total) onto the skin daily. 28 patch 0   No facility-administered medications prior to visit.    Family History  Problem Relation Age of Onset   Cancer Mother    Heart disease Mother    Miscarriages / Stillbirths Mother    Obesity Mother    Diabetes Father    Cancer Father     Social History   Socioeconomic History   Marital status: Married    Spouse name: Not on file   Number of children: Not on file   Years of education: Not on file   Highest education level: Not on file  Occupational History   Not on file  Tobacco Use   Smoking status: Every Day    Packs/day: 0.50    Years: 30.00    Total pack years: 15.00    Types: Cigarettes    Passive exposure: Never   Smokeless tobacco: Never  Vaping Use   Vaping Use: Never used  Substance and Sexual Activity   Alcohol use: Not Currently    Comment: 2 bottles a week   Drug use: Never   Sexual activity: Yes    Birth control/protection: None    Comment: Hysterectomy  Other Topics Concern   Not on file  Social History Narrative   Not on file   Social Determinants of Health   Financial Resource Strain: Not on file  Food Insecurity: Not on file  Transportation Needs: Not on file  Physical Activity: Not on file  Stress: Not on file  Social Connections: Not on file  Intimate Partner Violence: Not on file                                                                                                 Objective:  Physical Exam: BP 130/88   Pulse 83   Temp 98.5 F (36.9 C) (Oral)   Ht  '5\' 4"'$  (1.626 m)   Wt 150 lb (68 kg)   SpO2 96%   BMI 25.75 kg/m    Gen: NAD, resting comfortably CV: RRR with no murmurs appreciated Pulm: NWOB, CTAB with no crackles, wheezes, or rhonchi GI: Normal bowel sounds present. Soft, Nontender, Nondistended. MSK: no edema, cyanosis, or clubbing noted Skin: warm, dry Neuro: grossly normal, moves all extremities Psych: Normal  affect and thought content   LABS: The patient's cholesterol levels were last drawn in 2019 and noted as elevated     Alesia Banda, MD, MS

## 2022-08-15 NOTE — Patient Instructions (Signed)
Continue work on quitting smoking.  Continue check blood pressure at home with numbers under 140/90.

## 2022-08-21 ENCOUNTER — Other Ambulatory Visit (INDEPENDENT_AMBULATORY_CARE_PROVIDER_SITE_OTHER): Payer: BC Managed Care – PPO

## 2022-08-21 DIAGNOSIS — E785 Hyperlipidemia, unspecified: Secondary | ICD-10-CM | POA: Diagnosis not present

## 2022-08-21 DIAGNOSIS — I10 Essential (primary) hypertension: Secondary | ICD-10-CM | POA: Diagnosis not present

## 2022-08-21 DIAGNOSIS — R809 Proteinuria, unspecified: Secondary | ICD-10-CM

## 2022-08-21 LAB — CBC WITH DIFFERENTIAL/PLATELET
Basophils Absolute: 0.1 10*3/uL (ref 0.0–0.1)
Basophils Relative: 1.3 % (ref 0.0–3.0)
Eosinophils Absolute: 0.3 10*3/uL (ref 0.0–0.7)
Eosinophils Relative: 3.1 % (ref 0.0–5.0)
HCT: 49.9 % — ABNORMAL HIGH (ref 36.0–46.0)
Hemoglobin: 17.4 g/dL — ABNORMAL HIGH (ref 12.0–15.0)
Lymphocytes Relative: 29.7 % (ref 12.0–46.0)
Lymphs Abs: 2.7 10*3/uL (ref 0.7–4.0)
MCHC: 34.8 g/dL (ref 30.0–36.0)
MCV: 98.6 fl (ref 78.0–100.0)
Monocytes Absolute: 0.7 10*3/uL (ref 0.1–1.0)
Monocytes Relative: 7.7 % (ref 3.0–12.0)
Neutro Abs: 5.3 10*3/uL (ref 1.4–7.7)
Neutrophils Relative %: 58.2 % (ref 43.0–77.0)
Platelets: 323 10*3/uL (ref 150.0–400.0)
RBC: 5.07 Mil/uL (ref 3.87–5.11)
RDW: 13.7 % (ref 11.5–15.5)
WBC: 9.2 10*3/uL (ref 4.0–10.5)

## 2022-08-21 LAB — COMPREHENSIVE METABOLIC PANEL
ALT: 19 U/L (ref 0–35)
AST: 18 U/L (ref 0–37)
Albumin: 4.4 g/dL (ref 3.5–5.2)
Alkaline Phosphatase: 46 U/L (ref 39–117)
BUN: 11 mg/dL (ref 6–23)
CO2: 29 mEq/L (ref 19–32)
Calcium: 9.8 mg/dL (ref 8.4–10.5)
Chloride: 101 mEq/L (ref 96–112)
Creatinine, Ser: 0.63 mg/dL (ref 0.40–1.20)
GFR: 103.17 mL/min (ref >60.00)
Glucose, Bld: 90 mg/dL (ref 70–99)
Potassium: 5 mEq/L (ref 3.5–5.1)
Sodium: 138 mEq/L (ref 135–145)
Total Bilirubin: 0.7 mg/dL (ref 0.2–1.2)
Total Protein: 6.9 g/dL (ref 6.0–8.3)

## 2022-08-21 LAB — LIPID PANEL
Cholesterol: 260 mg/dL — ABNORMAL HIGH (ref 0–200)
HDL: 87.7 mg/dL (ref >39.00)
LDL Cholesterol: 143 mg/dL — ABNORMAL HIGH (ref 0–99)
NonHDL: 172.4
Total CHOL/HDL Ratio: 3
Triglycerides: 146 mg/dL (ref 0.0–149.0)
VLDL: 29.2 mg/dL (ref 0.0–40.0)

## 2022-08-21 LAB — URINALYSIS, ROUTINE W REFLEX MICROSCOPIC
Bilirubin Urine: NEGATIVE
Hgb urine dipstick: NEGATIVE
Ketones, ur: NEGATIVE
Leukocytes,Ua: NEGATIVE
Nitrite: NEGATIVE
RBC / HPF: NONE SEEN (ref 0–?)
Specific Gravity, Urine: <=1.005 — AB (ref 1.000–1.030)
Urine Glucose: NEGATIVE
Urobilinogen, UA: 0.2 (ref 0.0–1.0)
WBC, UA: NONE SEEN (ref 0–?)
pH: 7 (ref 5.0–8.0)

## 2022-08-21 LAB — HEMOGLOBIN A1C: Hgb A1c MFr Bld: 5.4 % (ref 4.6–6.5)

## 2022-08-21 LAB — MICROALBUMIN / CREATININE URINE RATIO
Creatinine,U: 14.7 mg/dL
Microalb Creat Ratio: 122.2 mg/g — ABNORMAL HIGH (ref 0.0–30.0)
Microalb, Ur: 18 mg/dL — ABNORMAL HIGH (ref 0.0–1.9)

## 2022-08-21 LAB — TSH: TSH: 1.86 u[IU]/mL (ref 0.35–5.50)

## 2022-08-22 NOTE — Addendum Note (Signed)
Addended by: Josephine Igo B on: 08/22/2022 01:52 PM   Modules accepted: Orders

## 2022-08-28 ENCOUNTER — Other Ambulatory Visit: Payer: BC Managed Care – PPO

## 2022-08-28 DIAGNOSIS — R809 Proteinuria, unspecified: Secondary | ICD-10-CM

## 2022-08-28 LAB — MICROALBUMIN / CREATININE URINE RATIO
Creatinine,U: 23.1 mg/dL
Microalb Creat Ratio: 139 mg/g — ABNORMAL HIGH (ref 0.0–30.0)
Microalb, Ur: 32.1 mg/dL — ABNORMAL HIGH (ref 0.0–1.9)

## 2022-09-11 ENCOUNTER — Other Ambulatory Visit: Payer: Self-pay | Admitting: Family Medicine

## 2022-09-11 DIAGNOSIS — Z1231 Encounter for screening mammogram for malignant neoplasm of breast: Secondary | ICD-10-CM

## 2022-09-16 ENCOUNTER — Ambulatory Visit
Admission: RE | Admit: 2022-09-16 | Discharge: 2022-09-16 | Disposition: A | Discharge status: Home / Self Care | Payer: BC Managed Care – PPO | Source: Ambulatory Visit | Attending: Family Medicine | Admitting: Family Medicine

## 2022-09-16 ENCOUNTER — Other Ambulatory Visit: Payer: Self-pay | Admitting: Family Medicine

## 2022-09-16 DIAGNOSIS — Z1231 Encounter for screening mammogram for malignant neoplasm of breast: Secondary | ICD-10-CM | POA: Diagnosis not present

## 2022-09-16 DIAGNOSIS — F17200 Nicotine dependence, unspecified, uncomplicated: Secondary | ICD-10-CM

## 2022-09-19 NOTE — Telephone Encounter (Signed)
Caller Name: [facility] Call back phone #: [phone]   MEDICATION(S):  Nicotine patches 14 mg   Preferred Pharmacy:  [facility] in [place]  ~~~Please advise patient/caregiver to allow 2-3 business days to process RX refills.

## 2022-09-20 MED ORDER — NICOTINE 14 MG/24HR TD PT24: 14.0000 mg | MEDICATED_PATCH | Freq: Every day | TRANSDERMAL | 0 refills | Status: DC

## 2022-10-15 ENCOUNTER — Other Ambulatory Visit: Payer: Self-pay | Admitting: Family Medicine

## 2022-10-15 DIAGNOSIS — F17200 Nicotine dependence, unspecified, uncomplicated: Secondary | ICD-10-CM

## 2022-10-16 MED ORDER — NICOTINE 14 MG/24HR TD PT24: 14.0000 mg | MEDICATED_PATCH | Freq: Every day | TRANSDERMAL | 0 refills | Status: AC

## 2022-11-12 ENCOUNTER — Telehealth: Payer: Self-pay | Admitting: Family Medicine

## 2022-11-12 NOTE — Telephone Encounter (Signed)
Caller Name: [facility] Call back phone #: [phone]   MEDICATION(S):  nicotine (NICODERM CQ - DOSED IN MG/24 HOURS) 14 mg/24hr patch  3rd step is needed 7 mg   Preferred Pharmacy:  [facility] in [place]  Not [facility] in [place]

## 2022-11-15 NOTE — Telephone Encounter (Signed)
Patient is aware of annotation below and verbalized understanding. Patient is currently taking care of her mom out of town after she had a terrible fall. She stated that she has been anxious since the injury happened to her mom on Wednesday and would like to discuss that as well. I offered a video visit and she confirmed for 11/19/2022 at 3:40pm.

## 2022-11-19 ENCOUNTER — Encounter: Payer: Self-pay | Admitting: Family Medicine

## 2022-11-19 ENCOUNTER — Telehealth (INDEPENDENT_AMBULATORY_CARE_PROVIDER_SITE_OTHER): Payer: BC Managed Care – PPO | Admitting: Family Medicine

## 2022-11-19 DIAGNOSIS — F4322 Adjustment disorder with anxiety: Secondary | ICD-10-CM

## 2022-11-19 DIAGNOSIS — F17200 Nicotine dependence, unspecified, uncomplicated: Secondary | ICD-10-CM

## 2022-11-19 MED ORDER — NICOTINE 7 MG/24HR TD PT24: 7.0000 mg | MEDICATED_PATCH | Freq: Every day | TRANSDERMAL | 0 refills | Status: AC

## 2022-11-19 MED ORDER — ESCITALOPRAM OXALATE 10 MG PO TABS
ORAL_TABLET | ORAL | 0 refills | Status: AC
Start: 2022-11-19 — End: 2022-12-19

## 2022-11-19 MED ORDER — HYDROXYZINE HCL 10 MG PO TABS: 10.0000 mg | ORAL_TABLET | Freq: Three times a day (TID) | ORAL | 0 refills | Status: DC | PRN

## 2022-11-19 NOTE — Assessment & Plan Note (Signed)
The patient has had a significant change in life circumstances and is experiencing anxiety and mood disturbances. PHQ-9 and GAD-7 scores indicate severe depression and anxiety, respectively.  Plan:  Initiate escitalopram (LEXAPRO) at 5 mg for two weeks to assess tolerance, then increase to 10 mg as tolerated, with a reassessment in one month's time. Offer reassurance about the non-addictive nature of escitalopram and explain the importance of serotonin in mood regulation. Recommend exploring employee assistance programs (EAP) for additional support.

## 2022-11-19 NOTE — Assessment & Plan Note (Signed)
Patient is continuing efforts to quit smoking with the use of nicotine replacement therapy. Currently using nicotine patches 7 mg/24hr patch with progress in smoking cessation noted.  Plan:  Encourage continued use of nicotine replacement therapy to maintain cessation progress. Reassess effectiveness and adherence to nicotine patches in following visits.

## 2022-11-19 NOTE — Progress Notes (Signed)
Virtual Visit via Video Note  I connected with Cindy Cochran on 11/19/2022 at  3:40 PM EDT by a video enabled telemedicine application and verified that I am speaking with the correct person using two identifiers.  Location: Patient: Home Provider: Office   I discussed the limitations of evaluation and management by telemedicine and the availability of in person appointments. The patient expressed understanding and agreed to proceed.   CHIEF COMPLAINT: The patient presents for a follow-up regarding smoking cessation efforts. Additionally, the patient has been experiencing increased anxiety and adjustment difficulties related to managing care for her mother, who recently suffered a significant fall.  HISTORY OF PRESENT ILLNESS:  Smoking Cessation: The patient continues efforts to quit smoking. Despite the stress from her mother's injury and the subsequent increased responsibilities, the patient has made positive lifestyle changes including exercise and maintaining a healthy diet to aid in smoking cessation and mood improvement.  Adjustment Disorder with Anxiety: The patient reports heightened anxiety since her mother's incident on 11/13/2022. She is experiencing mood disturbances, including anxious and depressive symptoms.      11/19/2022    3:28 PM 07/03/2022    3:26 PM  Depression screen PHQ 2/9  Decreased Interest 3 0  Down, Depressed, Hopeless 3 0  PHQ - 2 Score 6 0  Altered sleeping 3 0  Tired, decreased energy 3 0  Change in appetite 1 0  Feeling bad or failure about yourself  2 0  Trouble concentrating 3 0  Moving slowly or fidgety/restless 2 0  Suicidal thoughts 0 0  PHQ-9 Score 20 0  Difficult doing work/chores Extremely dIfficult Not difficult at all      11/19/2022    3:30 PM 07/03/2022    3:26 PM  GAD 7 : Generalized Anxiety Score  Nervous, Anxious, on Edge 3 1  Control/stop worrying 3 0  Worry too much - different things 3 1  Trouble relaxing 2 1  Restless 2 0   Easily annoyed or irritable 3 0  Afraid - awful might happen 3 0  Total GAD 7 Score 19 3  Anxiety Difficulty Extremely difficult Somewhat difficult    Review of Systems  Respiratory:  Negative for cough, shortness of breath and wheezing.   Cardiovascular:  Negative for chest pain.  Psychiatric/Behavioral:  Positive for depression. Negative for suicidal ideas. The patient is nervous/anxious.   All other systems reviewed and are negative.     Observations/Objective: There were no vitals filed for this visit.   Gen: NAD, resting comfortably HEENT: EOMI Pulm: NWOB Skin: no rash on face Neuro: no facial asymmetry or dysmetria Psych: Normal affect   Assessment and Plan: Problem List Items Addressed This Visit       Other   Tobacco use disorder    Patient is continuing efforts to quit smoking with the use of nicotine replacement therapy. Currently using nicotine patches 7 mg/24hr patch with progress in smoking cessation noted.  Plan:  Encourage continued use of nicotine replacement therapy to maintain cessation progress. Reassess effectiveness and adherence to nicotine patches in following visits.      Relevant Medications   nicotine (NICODERM CQ - DOSED IN MG/24 HR) 7 mg/24hr patch   Adjustment disorder with anxious mood - Primary    The patient has had a significant change in life circumstances and is experiencing anxiety and mood disturbances. PHQ-9 and GAD-7 scores indicate severe depression and anxiety, respectively.  Plan:  Initiate escitalopram (LEXAPRO) at 5 mg for two weeks to  assess tolerance, then increase to 10 mg as tolerated, with a reassessment in one month's time. Offer reassurance about the non-addictive nature of escitalopram and explain the importance of serotonin in mood regulation. Recommend exploring employee assistance programs (EAP) for additional support.      Relevant Medications   escitalopram (LEXAPRO) 10 MG tablet    Medications  Discontinued During This Encounter  Medication Reason   hydrOXYzine (ATARAX) 10 MG tablet      Follow Up Instructions: Return in about 4 weeks (around 12/17/2022).    I discussed the assessment and treatment plan with the patient. The patient was provided an opportunity to ask questions and all were answered. The patient agreed with the plan and demonstrated an understanding of the instructions.   The patient was advised to call back or seek an in-person evaluation if the symptoms worsen or if the condition fails to improve as anticipated.  Garnette Gunner, MD

## 2023-06-18 IMAGING — MG MM DIGITAL SCREENING BILAT W/ TOMO AND CAD
6 of 10 series · 6 of 30 positions shown · non-contrast
Comparison: Previous exam(s).

CLINICAL DATA: Screening.

EXAM:
DIGITAL SCREENING BILATERAL MAMMOGRAM WITH TOMOSYNTHESIS AND CAD
TECHNIQUE: Bilateral screening digital craniocaudal and mediolateral oblique
mammograms were obtained. Bilateral screening digital breast
tomosynthesis was performed. The images were evaluated with
computer-aided detection.

[L CC synth-2D]
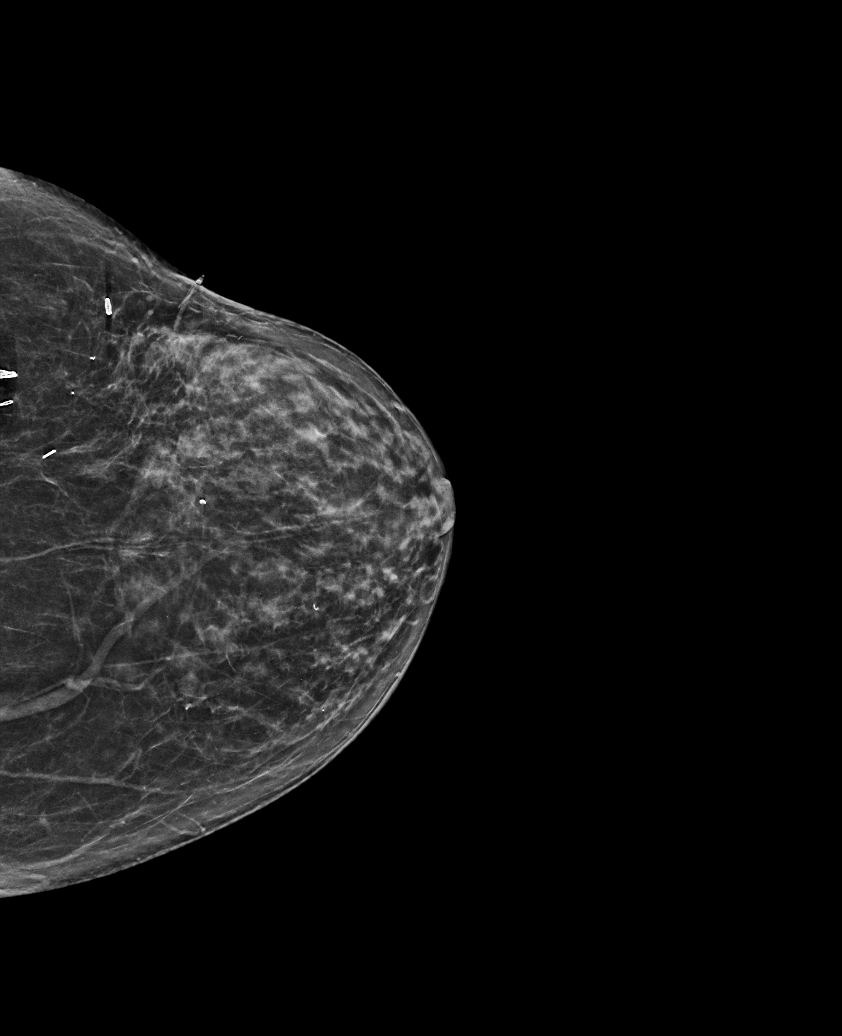

[R MLO synth-2D]
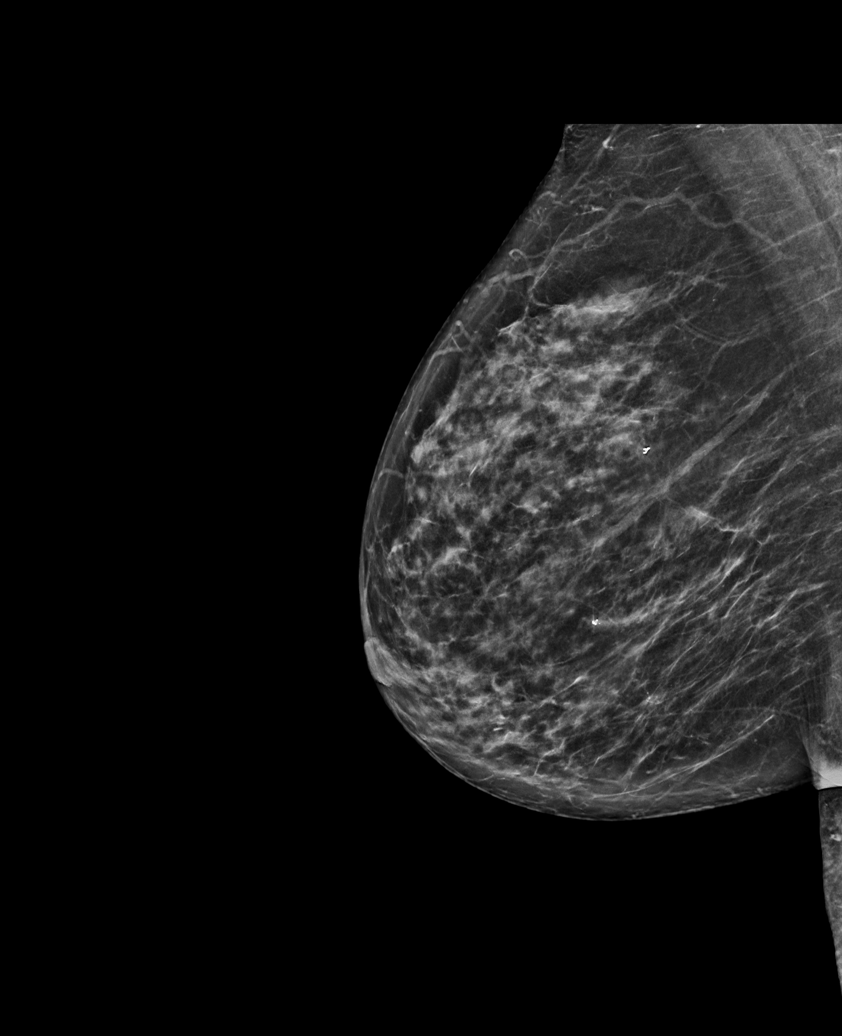

[R CC synth-2D]
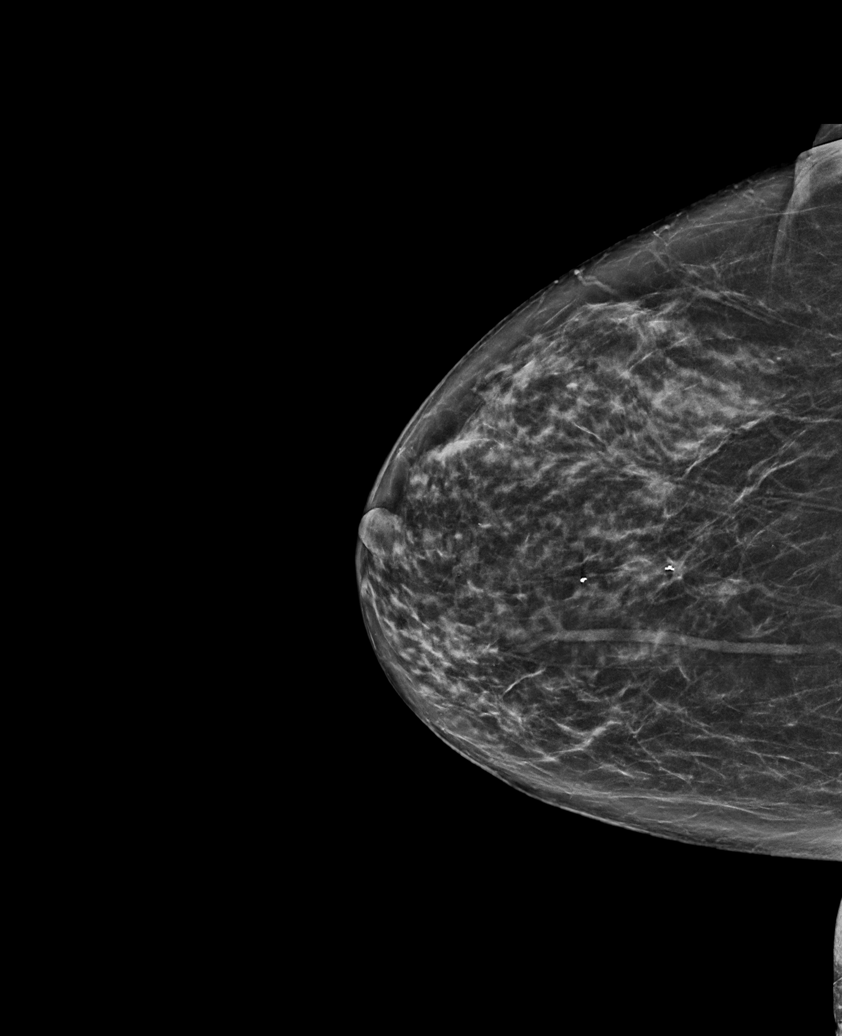

[L XCCL synth-2D]
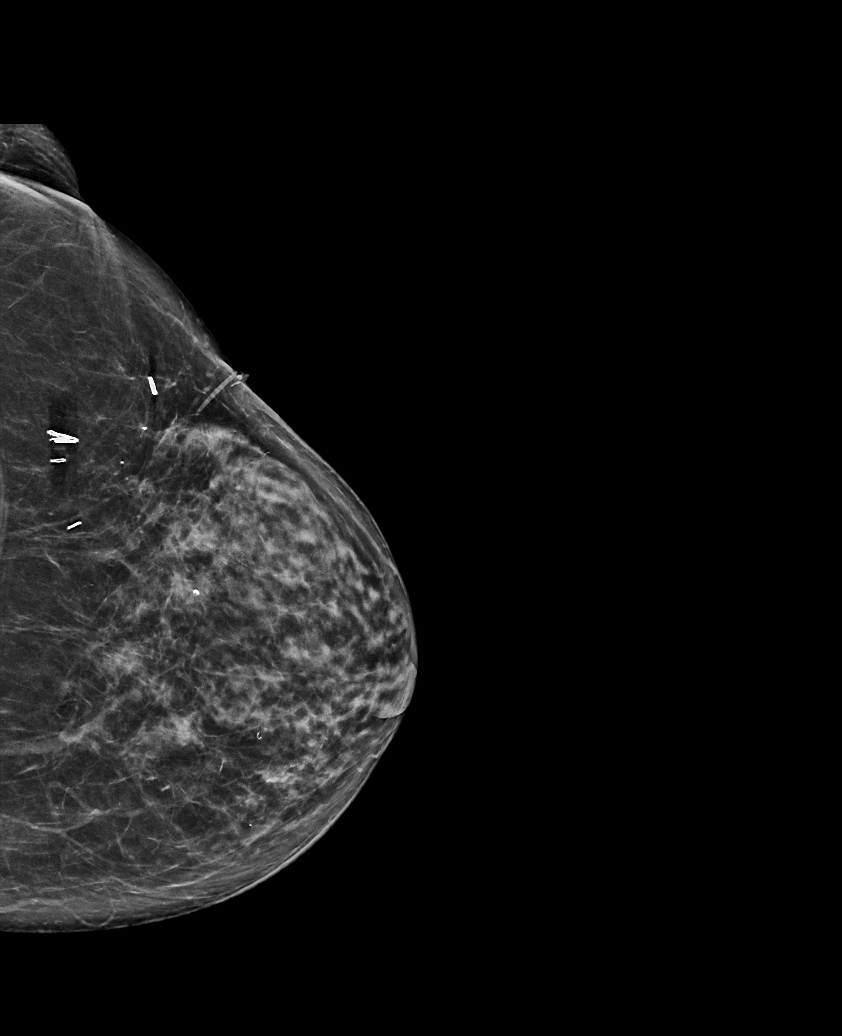

[L MLO synth-2D]
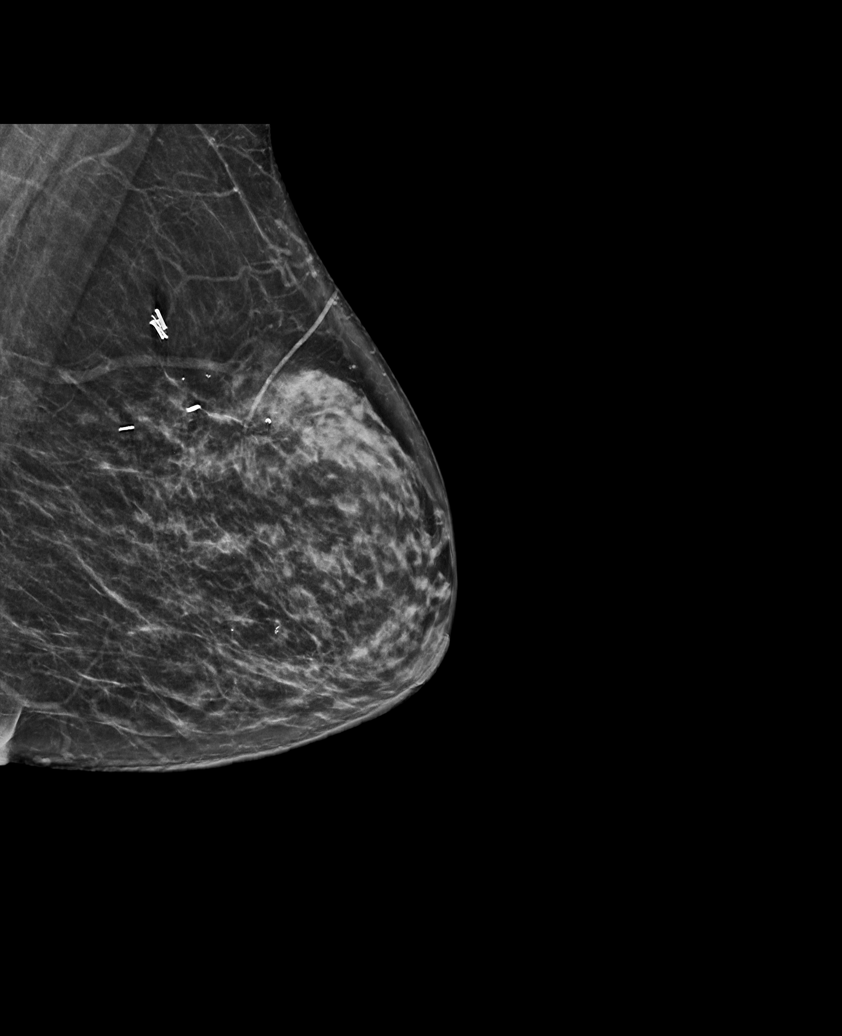

[L CC tomo · tomo slice 31/60.0]
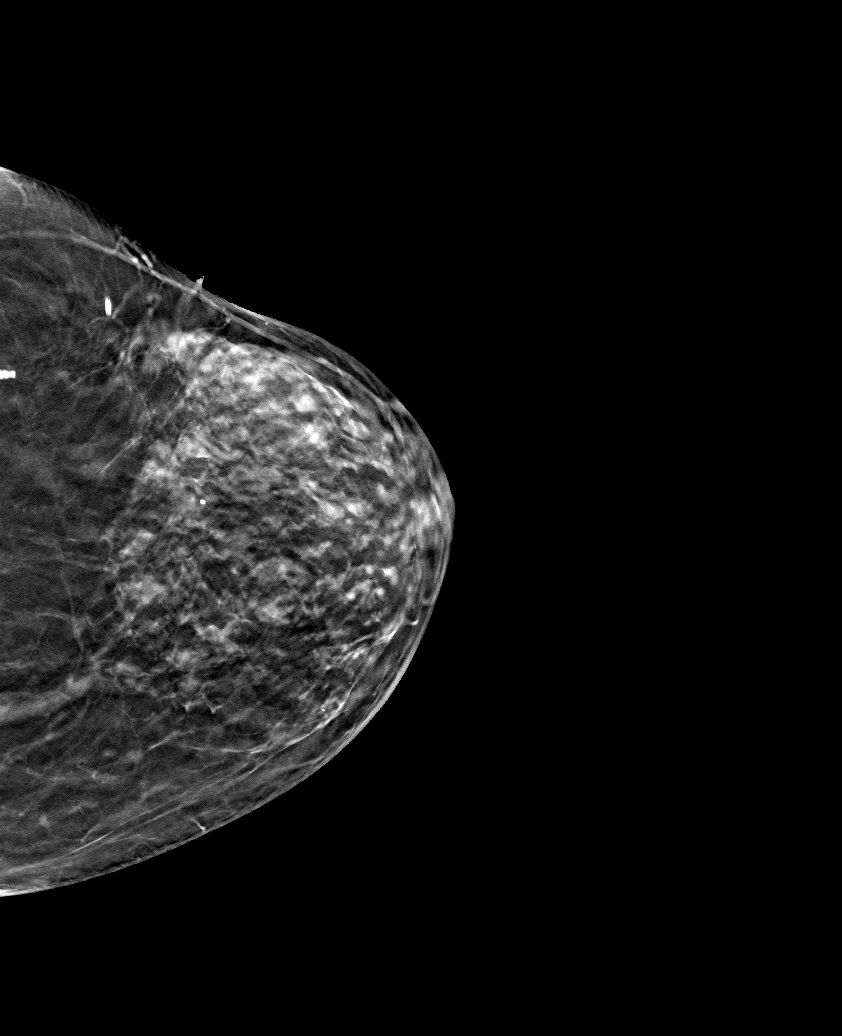

[6 of 30 positions shown; findings below may reference images not displayed]

ACR Breast Density Category c: The breast tissue is heterogeneously
dense, which may obscure small masses.
FINDINGS: There are no findings suspicious for malignancy.
IMPRESSION: No mammographic evidence of malignancy. A result letter of this
screening mammogram will be mailed directly to the patient.

RECOMMENDATION:
Screening mammogram in one year. (Code:Q3-W-BC3)

BI-RADS CATEGORY  1: Negative.

## 2023-08-12 ENCOUNTER — Other Ambulatory Visit: Payer: Self-pay | Admitting: Family Medicine

## 2023-08-12 DIAGNOSIS — Z1231 Encounter for screening mammogram for malignant neoplasm of breast: Secondary | ICD-10-CM

## 2023-08-22 DIAGNOSIS — Z1231 Encounter for screening mammogram for malignant neoplasm of breast: Secondary | ICD-10-CM

## 2023-09-01 ENCOUNTER — Ambulatory Visit (INDEPENDENT_AMBULATORY_CARE_PROVIDER_SITE_OTHER): Payer: BC Managed Care – PPO | Admitting: Family Medicine

## 2023-09-01 ENCOUNTER — Encounter: Payer: Self-pay | Admitting: Family Medicine

## 2023-09-01 ENCOUNTER — Other Ambulatory Visit: Payer: Self-pay

## 2023-09-01 VITALS — BP 120/76 | HR 62 | Temp 96.9°F | Resp 18 | Ht 64.0 in | Wt 165.2 lb

## 2023-09-01 DIAGNOSIS — Z800 Family history of malignant neoplasm of digestive organs: Secondary | ICD-10-CM

## 2023-09-01 DIAGNOSIS — Z1159 Encounter for screening for other viral diseases: Secondary | ICD-10-CM

## 2023-09-01 DIAGNOSIS — M25512 Pain in left shoulder: Secondary | ICD-10-CM | POA: Diagnosis not present

## 2023-09-01 DIAGNOSIS — D45 Polycythemia vera: Secondary | ICD-10-CM

## 2023-09-01 DIAGNOSIS — Z0000 Encounter for general adult medical examination without abnormal findings: Secondary | ICD-10-CM | POA: Diagnosis not present

## 2023-09-01 DIAGNOSIS — E782 Mixed hyperlipidemia: Secondary | ICD-10-CM | POA: Diagnosis not present

## 2023-09-01 DIAGNOSIS — Z1211 Encounter for screening for malignant neoplasm of colon: Secondary | ICD-10-CM

## 2023-09-01 DIAGNOSIS — B001 Herpesviral vesicular dermatitis: Secondary | ICD-10-CM

## 2023-09-01 DIAGNOSIS — R801 Persistent proteinuria, unspecified: Secondary | ICD-10-CM | POA: Diagnosis not present

## 2023-09-01 DIAGNOSIS — M25532 Pain in left wrist: Secondary | ICD-10-CM

## 2023-09-01 DIAGNOSIS — Z87891 Personal history of nicotine dependence: Secondary | ICD-10-CM

## 2023-09-01 LAB — URINALYSIS, ROUTINE W REFLEX MICROSCOPIC
Bilirubin Urine: NEGATIVE
Hgb urine dipstick: NEGATIVE
Ketones, ur: NEGATIVE
Leukocytes,Ua: NEGATIVE
Nitrite: NEGATIVE
RBC / HPF: NONE SEEN (ref 0–?)
Specific Gravity, Urine: <=1.005 — AB (ref 1.000–1.030)
Total Protein, Urine: NEGATIVE
Urine Glucose: NEGATIVE
Urobilinogen, UA: 0.2 (ref 0.0–1.0)
pH: 6 (ref 5.0–8.0)

## 2023-09-01 LAB — CBC WITH DIFFERENTIAL/PLATELET
Basophils Absolute: 0.1 10*3/uL (ref 0.0–0.1)
Basophils Relative: 1.1 % (ref 0.0–3.0)
Eosinophils Absolute: 0.4 10*3/uL (ref 0.0–0.7)
Eosinophils Relative: 4.2 % (ref 0.0–5.0)
HCT: 43.1 % (ref 36.0–46.0)
Hemoglobin: 14.6 g/dL (ref 12.0–15.0)
Lymphocytes Relative: 28.1 % (ref 12.0–46.0)
Lymphs Abs: 2.4 10*3/uL (ref 0.7–4.0)
MCHC: 33.8 g/dL (ref 30.0–36.0)
MCV: 93.4 fL (ref 78.0–100.0)
Monocytes Absolute: 0.8 10*3/uL (ref 0.1–1.0)
Monocytes Relative: 8.8 % (ref 3.0–12.0)
Neutro Abs: 5 10*3/uL (ref 1.4–7.7)
Neutrophils Relative %: 57.8 % (ref 43.0–77.0)
Platelets: 372 10*3/uL (ref 150.0–400.0)
RBC: 4.62 Mil/uL (ref 3.87–5.11)
RDW: 13 % (ref 11.5–15.5)
WBC: 8.7 10*3/uL (ref 4.0–10.5)

## 2023-09-01 LAB — HEMOGLOBIN A1C: Hgb A1c MFr Bld: 5.7 % (ref 4.6–6.5)

## 2023-09-01 LAB — TSH: TSH: 1.09 u[IU]/mL (ref 0.35–5.50)

## 2023-09-01 MED ORDER — VALACYCLOVIR HCL 1 G PO TABS
2000.0000 mg | ORAL_TABLET | Freq: Two times a day (BID) | ORAL | 0 refills | Status: DC
Start: 2023-09-01 — End: 2023-09-04

## 2023-09-01 MED ORDER — VALACYCLOVIR HCL 1 G PO TABS: 2000.0000 mg | ORAL_TABLET | Freq: Two times a day (BID) | ORAL | 0 refills | Status: DC

## 2023-09-01 NOTE — Progress Notes (Signed)
 Assessment  Assessment/Plan:      Left Shoulder Pain Chronic left shoulder pain with possible internal cyst or musculoskeletal issue. Pain exacerbated by pressure and certain movements, with occasional numbness or tingling. Differential diagnosis includes rotator cuff injury. Referral to orthopedics for further evaluation and potential advanced imaging or therapies discussed. - Refer to orthopedics for further evaluation and potential advanced imaging or therapies  Polycythemia Vera Polycythemia vera managed with phlebotomy. Monitoring needed to assess impact of smoking cessation on blood counts, which may reduce the need for phlebotomy. - Check blood counts to assess the impact of smoking cessation  Elevated Hemoglobin Elevated hemoglobin likely related to smoking. Monitoring needed to assess impact of smoking cessation, which may reduce hemoglobin levels. - Recheck hemoglobin levels  Hyperlipidemia Elevated cholesterol levels. Monitoring needed to assess current levels. Discussed lifestyle changes, including diet and exercise, to positively impact cholesterol levels. - Recheck cholesterol levels  Proteinuria Mild proteinuria, previously well-managed. Monitoring needed to assess current levels and potential improvement post-smoking cessation, which may reduce the risk of kidney damage. - Recheck microalbumin/creatinine ratio  Cold Sores (Herpes Labialis) Recurrent cold sores. Patient requests Valtrex  prescription. Discussed importance of having medication on hand to start treatment at the first sign of an outbreak. - Prescribe Valtrex  for cold sores  General Health Maintenance Significant lifestyle changes including smoking cessation and resuming exercise. Blood pressure well-controlled. Dietary habits improved to manage weight gain post-smoking cessation. Discussed benefits of continued exercise and healthy eating for overall health and weight management. - Encourage continued  exercise and healthy eating habits - Recheck lab work including cholesterol, blood sugar, and liver function  Cancer Screening Family history of cancer, including concern about pancreatic cancer. Patient interested in colon cancer screening and understanding pancreatic cancer risk. Discussed lack of good screening guidelines for pancreatic cancer. Recommended referral to gastroenterology for colon cancer screening options and pancreatic cancer risk assessment. - Refer to gastroenterology for discussion on colon cancer screening options and pancreatic cancer risk assessment  Follow-up - Referral to orthopedics for shoulder pain - Referral to gastroenterology for cancer screening - Recheck lab work including cholesterol, hemoglobin, and microalbumin/creatinine ratio.       There are no discontinued medications.  Patient Counseling(The following topics were reviewed and/or handout was given):  -Nutrition: Stressed importance of moderation in sodium/caffeine intake, saturated fat and cholesterol, caloric balance, sufficient intake of fresh fruits, vegetables, and fiber.  -Stressed the importance of regular exercise.   -Substance Abuse: Discussed cessation/primary prevention of tobacco, alcohol, or other drug use; driving or other dangerous activities under the influence; availability of treatment for abuse.   -Injury prevention: Discussed safety belts, safety helmets, smoke detector, smoking near bedding or upholstery.   -Sexuality: Discussed sexually transmitted diseases, partner selection, use of condoms, avoidance of unintended pregnancy and contraceptive alternatives.   -Dental health: Discussed importance of regular tooth brushing, flossing, and dental visits.  -Health maintenance and immunizations reviewed. Please refer to Health maintenance section.  Return to care in 1 year for next preventative visit.        Subjective:  Chief complaint Encounter date: 09/01/2023  Chief  Complaint  Patient presents with   Annual Exam    PT C/O of cyst or lump on back of left shoulder with soreness for 2 -3 months; she also mentioned left wrist discomfort possible carpel tunnel; cologuard is due    Cindy Cochran is a 53 y.o. female who presents today for her annual comprehensive physical exam.    Discussed  the use of AI scribe software for clinical note transcription with the patient, who gave verbal consent to proceed.  History of Present Illness   Cindy Cochran is a 53 year old female with polycythemia vera who presents for an annual physical exam and evaluation of left shoulder pain.  She experiences left shoulder pain, described as internal with occasional numbness or tingling in the arm. The pain is exacerbated by pressure and certain movements and began a couple of months ago. This issue is distinct from her long-standing wrist problems, which she attributes to prolonged computer use. She occasionally uses a wrist brace.  She has a history of polycythemia vera, diagnosed many years ago, initially managed with phlebotomy, which she no longer requires. Her blood counts were previously affected by smoking, which she quit last year. Since quitting smoking, she has noticed improvements in her blood pressure, which is currently stable. She has gained some weight post-smoking cessation but has recently resumed exercising to address this. Her dietary habits have fluctuated, and she is currently managing her weight by monitoring carbohydrate intake and making healthier food choices.  She has a family history of cancer, with her father having passed away from cancer, possibly originating in the pancreas. This has raised concerns for her, especially after a family friend was recently diagnosed with pancreatic cancer. She is considering a colonoscopy due to her family history.  She experiences occasional cold sores and previously had a prescription for Valtrex , which she finds helpful to  have on hand. She manages her cold sores with multivitamins and avoids known triggers like chocolate and excessive sun exposure.          09/01/2023    1:19 PM 11/19/2022    3:28 PM 07/03/2022    3:26 PM  Depression screen PHQ 2/9  Decreased Interest 1 3 0  Down, Depressed, Hopeless 1 3 0  PHQ - 2 Score 2 6 0  Altered sleeping 1 3 0  Tired, decreased energy 1 3 0  Change in appetite 0 1 0  Feeling bad or failure about yourself  0 2 0  Trouble concentrating 0 3 0  Moving slowly or fidgety/restless 0 2 0  Suicidal thoughts 0 0 0  PHQ-9 Score 4 20 0  Difficult doing work/chores Somewhat difficult Extremely dIfficult Not difficult at all       09/01/2023    1:20 PM 11/19/2022    3:30 PM 07/03/2022    3:26 PM  GAD 7 : Generalized Anxiety Score  Nervous, Anxious, on Edge 1 3 1   Control/stop worrying 0 3 0  Worry too much - different things 1 3 1   Trouble relaxing 1 2 1   Restless 0 2 0  Easily annoyed or irritable 1 3 0  Afraid - awful might happen 0 3 0  Total GAD 7 Score 4 19 3   Anxiety Difficulty Somewhat difficult Extremely difficult Somewhat difficult    Health Maintenance Due  Topic Date Due   HIV Screening  Never done   Hepatitis C Screening  Never done   Colonoscopy  Never done       PMH:  The following were reviewed and entered/updated in epic: Past Medical History:  Diagnosis Date   Anxiety    Arthritis in foot and left hand   Hyperlipidemia 08/15/2022   Hypertension    Polycythemia vera (HCC)     Patient Active Problem List   Diagnosis Date Noted   Adjustment disorder with anxious mood 11/19/2022  Hyperlipidemia 08/15/2022   Primary hypertension 07/03/2022   Tobacco use disorder 07/03/2022   Polycythemia vera (HCC) 07/03/2022    Past Surgical History:  Procedure Laterality Date   BREAST BIOPSY Right 2020   BREAST EXCISIONAL BIOPSY Left 10/03/2017   pash   BREAST SURGERY     CESAREAN SECTION      Family History  Problem Relation Age of  Onset   Cancer Mother    Heart disease Mother    Miscarriages / Stillbirths Mother    Obesity Mother    Diabetes Father    Cancer Father    Breast cancer Neg Hx     Medications- reviewed and updated Outpatient Medications Prior to Visit  Medication Sig Dispense Refill   nicotine  (NICODERM CQ  - DOSED IN MG/24 HR) 7 mg/24hr patch Place 1 patch (7 mg total) onto the skin daily. 28 patch 0   escitalopram  (LEXAPRO ) 10 MG tablet Take 0.5 tablets (5 mg total) by mouth daily for 14 days, THEN 1 tablet (10 mg total) daily for 16 days. 23 tablet 0   No facility-administered medications prior to visit.    Allergies  Allergen Reactions   Other Rash    Patient experienced rash and itching after surgery in April.  Had to be seen in ED due to rash.  Unsure if it is a reaction to the gauze/dressing or cleansing agent used during surgery.   Penicillins Hives   Tape Rash    Social History   Socioeconomic History   Marital status: Married    Spouse name: Not on file   Number of children: Not on file   Years of education: Not on file   Highest education level: Not on file  Occupational History   Not on file  Tobacco Use   Smoking status: Former    Current packs/day: 0.50    Average packs/day: 0.5 packs/day for 30.0 years (15.0 ttl pk-yrs)    Types: Cigarettes    Passive exposure: Never   Smokeless tobacco: Never  Vaping Use   Vaping status: Never Used  Substance and Sexual Activity   Alcohol use: Not Currently    Comment: 2 bottles a week   Drug use: Never   Sexual activity: Yes    Birth control/protection: None    Comment: Hysterectomy  Other Topics Concern   Not on file  Social History Narrative   Not on file   Social Drivers of Health   Financial Resource Strain: Not on file  Food Insecurity: Not on file  Transportation Needs: Not on file  Physical Activity: Not on file  Stress: Not on file  Social Connections: Not on file           Objective:  Physical  Exam: BP 120/76 (BP Location: Left Arm, Patient Position: Sitting, Cuff Size: Large)   Pulse 62   Temp (!) 96.9 F (36.1 C) (Temporal)   Resp 18   Ht 5\' 4"  (1.626 m)   Wt 165 lb 3.2 oz (74.9 kg)   SpO2 99%   BMI 28.36 kg/m   Body mass index is 28.36 kg/m. Wt Readings from Last 3 Encounters:  09/01/23 165 lb 3.2 oz (74.9 kg)  08/15/22 150 lb (68 kg)  07/03/22 150 lb 3.2 oz (68.1 kg)    Physical Exam Constitutional:      General: She is not in acute distress.    Appearance: Normal appearance. She is not ill-appearing or toxic-appearing.  HENT:     Head: Normocephalic and  atraumatic.     Right Ear: Hearing, tympanic membrane, ear canal and external ear normal. There is no impacted cerumen.     Left Ear: Hearing, tympanic membrane, ear canal and external ear normal. There is no impacted cerumen.     Nose: Nose normal. No congestion.     Mouth/Throat:     Lips: No lesions.     Mouth: Mucous membranes are moist.     Pharynx: Oropharynx is clear. No oropharyngeal exudate.  Eyes:     General: No scleral icterus.       Right eye: No discharge.        Left eye: No discharge.     Conjunctiva/sclera: Conjunctivae normal.     Pupils: Pupils are equal, round, and reactive to light.  Neck:     Thyroid : No thyroid  mass, thyromegaly or thyroid  tenderness.  Cardiovascular:     Rate and Rhythm: Normal rate and regular rhythm.     Pulses: Normal pulses.     Heart sounds: Normal heart sounds.  Pulmonary:     Effort: Pulmonary effort is normal. No respiratory distress.     Breath sounds: Normal breath sounds.  Abdominal:     General: Abdomen is flat. Bowel sounds are normal.     Palpations: Abdomen is soft.  Musculoskeletal:        General: Normal range of motion.     Left shoulder: Tenderness present. Normal range of motion.     Left wrist: Normal range of motion.     Cervical back: Normal range of motion.     Right lower leg: No edema.     Left lower leg: No edema.   Lymphadenopathy:     Cervical: No cervical adenopathy.  Skin:    General: Skin is warm and dry.     Findings: No rash.  Neurological:     General: No focal deficit present.     Mental Status: She is alert and oriented to person, place, and time. Mental status is at baseline.     Deep Tendon Reflexes:     Reflex Scores:      Patellar reflexes are 2+ on the right side and 2+ on the left side. Psychiatric:        Mood and Affect: Mood normal.        Behavior: Behavior normal.        Thought Content: Thought content normal.        Judgment: Judgment normal.         At today's visit, we discussed treatment options, associated risk and benefits, and engage in counseling as needed.  Additionally the following were reviewed: Past medical records, past medical and surgical history, family and social background, as well as relevant laboratory results, imaging findings, and specialty notes, where applicable.  This message was generated using dictation software, and as a result, it may contain unintentional typos or errors.  Nevertheless, extensive effort was made to accurately convey at the pertinent aspects of the patient visit.    There may have been are other unrelated non-urgent complaints, but due to the busy schedule and the amount of time already spent with her, time does not permit to address these issues at today's visit. Another appointment may have or has been requested to review these additional issues.     Harle Libra, MD, MS

## 2023-09-02 ENCOUNTER — Telehealth: Payer: Self-pay

## 2023-09-02 DIAGNOSIS — B001 Herpesviral vesicular dermatitis: Secondary | ICD-10-CM

## 2023-09-02 LAB — COMPREHENSIVE METABOLIC PANEL
ALT: 25 U/L (ref 0–35)
AST: 20 U/L (ref 0–37)
Albumin: 4.7 g/dL (ref 3.5–5.2)
Alkaline Phosphatase: 61 U/L (ref 39–117)
BUN: 14 mg/dL (ref 6–23)
CO2: 27 meq/L (ref 19–32)
Calcium: 9.6 mg/dL (ref 8.4–10.5)
Chloride: 100 meq/L (ref 96–112)
Creatinine, Ser: 0.62 mg/dL (ref 0.40–1.20)
GFR: 102.82 mL/min (ref >60.00)
Glucose, Bld: 80 mg/dL (ref 70–99)
Potassium: 3.8 meq/L (ref 3.5–5.1)
Sodium: 140 meq/L (ref 135–145)
Total Bilirubin: 0.4 mg/dL (ref 0.2–1.2)
Total Protein: 7.7 g/dL (ref 6.0–8.3)

## 2023-09-02 LAB — HIV ANTIBODY (ROUTINE TESTING W REFLEX): HIV 1&2 Ab, 4th Generation: NONREACTIVE

## 2023-09-02 LAB — HEPATITIS C ANTIBODY: Hepatitis C Ab: NONREACTIVE

## 2023-09-02 LAB — LIPID PANEL
Cholesterol: 241 mg/dL — ABNORMAL HIGH (ref 0–200)
HDL: 68.5 mg/dL (ref >39.00)
LDL Cholesterol: 138 mg/dL — ABNORMAL HIGH (ref 0–99)
NonHDL: 172.97
Total CHOL/HDL Ratio: 4
Triglycerides: 173 mg/dL — ABNORMAL HIGH (ref 0.0–149.0)
VLDL: 34.6 mg/dL (ref 0.0–40.0)

## 2023-09-02 NOTE — Telephone Encounter (Signed)
[facility] faxed over clarity for sig on valtrex 1000 mg medication. They asked "Is the patient taking this for 15 days or 1 day?". Can you please update the signature?

## 2023-09-04 ENCOUNTER — Encounter: Payer: Self-pay | Admitting: Family Medicine

## 2023-09-04 MED ORDER — VALACYCLOVIR HCL 1 G PO TABS: ORAL_TABLET | ORAL | 3 refills | Status: DC

## 2023-09-04 NOTE — Addendum Note (Signed)
Addended by: Fanny Bien B on: 09/04/2023 12:40 PM   Modules accepted: Orders

## 2023-09-05 MED ORDER — VALACYCLOVIR HCL 1 G PO TABS: ORAL_TABLET | ORAL | 3 refills | Status: AC

## 2023-09-05 NOTE — Telephone Encounter (Signed)
Pharmacy stated that they haven't received rx for valtrex. I sent in a refill

## 2023-09-05 NOTE — Addendum Note (Signed)
Addended by: Trudee Kuster on: 09/05/2023 10:41 AM   Modules accepted: Orders

## 2023-09-10 ENCOUNTER — Encounter: Payer: Self-pay | Admitting: Orthopaedic Surgery

## 2023-09-10 ENCOUNTER — Other Ambulatory Visit (INDEPENDENT_AMBULATORY_CARE_PROVIDER_SITE_OTHER): Payer: Self-pay

## 2023-09-10 ENCOUNTER — Ambulatory Visit (INDEPENDENT_AMBULATORY_CARE_PROVIDER_SITE_OTHER): Payer: BC Managed Care – PPO | Admitting: Orthopaedic Surgery

## 2023-09-10 DIAGNOSIS — M25512 Pain in left shoulder: Secondary | ICD-10-CM

## 2023-09-10 DIAGNOSIS — G8929 Other chronic pain: Secondary | ICD-10-CM | POA: Diagnosis not present

## 2023-09-10 NOTE — Progress Notes (Signed)
 Office Visit Note   Patient: Cindy Cochran           Date of Birth: 10/01/1970           MRN: [id number] Visit Date: 09/10/2023              Requested by: Garnette Gunner, MD [address] [place],  [place] [address] PCP: Garnette Gunner, MD   Assessment & Plan: Visit Diagnoses:  1. Chronic left shoulder pain     Plan: Cindy Cochran is a 53 year old female with a small subcutaneous lesion in the posterior axilla left shoulder.  Findings suggest that this is either an epidermal inclusion cyst or lipoma.  I have a low suspicion for malignancy.  I do understand she has a history of cancer so I am well aware and understanding of her concerns.  Options provided to include surgical excision, MRI, referral to general surgery, close monitoring.  She will think about her options.  Follow-Up Instructions: No follow-ups on file.   Orders:  Orders Placed This Encounter  Procedures   XR Shoulder Left   No orders of the defined types were placed in this encounter.     Procedures: No procedures performed   Clinical Data: No additional findings.   Subjective: Chief Complaint  Patient presents with   Left Shoulder - Pain    HPI Cindy Cochran is a 53 year old female who comes in for evaluation of a mass of her posterior shoulder.  Denies any injuries to that area.  Feels like she has burning and numbness and tingling in her left hand. Review of Systems  Constitutional: Negative.   HENT: Negative.    Eyes: Negative.   Respiratory: Negative.    Cardiovascular: Negative.   Endocrine: Negative.   Musculoskeletal: Negative.   Neurological: Negative.   Hematological: Negative.   Psychiatric/Behavioral: Negative.    All other systems reviewed and are negative.    Objective: Vital Signs: There were no vitals taken for this visit.  Physical Exam Vitals and nursing note reviewed.  Constitutional:      Appearance: She is well-developed.  HENT:     Head: Atraumatic.     Nose:  Nose normal.  Eyes:     Extraocular Movements: Extraocular movements intact.  Cardiovascular:     Pulses: Normal pulses.  Pulmonary:     Effort: Pulmonary effort is normal.  Abdominal:     Palpations: Abdomen is soft.  Musculoskeletal:     Cervical back: Neck supple.  Skin:    General: Skin is warm.     Capillary Refill: Capillary refill takes less than 2 seconds.  Neurological:     Mental Status: She is alert. Mental status is at baseline.  Psychiatric:        Behavior: Behavior normal.        Thought Content: Thought content normal.        Judgment: Judgment normal.     Ortho Exam Examination of the left shoulder girdle shows a small pea-sized subcutaneous mass in the posterior axillary region and upper back.  No aggressive features.  No skin changes.  Appears that there is light scar over top of the lesion.  This is firm and semimobile. Specialty Comments:  No specialty comments available.  Imaging: XR Shoulder Left Result Date: 09/10/2023 X-rays of the left shoulder show no acute or structural abnormalities     PMFS History: Patient Active Problem List   Diagnosis Date Noted   Adjustment disorder with anxious mood 11/19/2022  Hyperlipidemia 08/15/2022   Primary hypertension 07/03/2022   Tobacco use disorder 07/03/2022   Polycythemia vera (HCC) 07/03/2022   Past Medical History:  Diagnosis Date   Anxiety    Arthritis in foot and left hand   Hyperlipidemia 08/15/2022   Hypertension    Polycythemia vera (HCC)     Family History  Problem Relation Age of Onset   Cancer Mother    Heart disease Mother    Miscarriages / Stillbirths Mother    Obesity Mother    Diabetes Father    Cancer Father    Breast cancer Neg Hx     Past Surgical History:  Procedure Laterality Date   BREAST BIOPSY Right 2020   BREAST EXCISIONAL BIOPSY Left 10/03/2017   pash   BREAST SURGERY     CESAREAN SECTION     Social History   Occupational History   Not on file  Tobacco Use    Smoking status: Former    Current packs/day: 0.50    Average packs/day: 0.5 packs/day for 30.0 years (15.0 ttl pk-yrs)    Types: Cigarettes    Passive exposure: Never   Smokeless tobacco: Never  Vaping Use   Vaping status: Never Used  Substance and Sexual Activity   Alcohol use: Not Currently    Comment: 2 bottles a week   Drug use: Never   Sexual activity: Yes    Birth control/protection: None    Comment: Hysterectomy

## 2023-09-18 ENCOUNTER — Ambulatory Visit
Admission: RE | Admit: 2023-09-18 | Discharge: 2023-09-18 | Disposition: A | Discharge status: Home / Self Care | Payer: BC Managed Care – PPO | Source: Ambulatory Visit | Attending: Family Medicine

## 2023-09-18 DIAGNOSIS — Z1231 Encounter for screening mammogram for malignant neoplasm of breast: Secondary | ICD-10-CM | POA: Diagnosis not present

## 2023-09-24 ENCOUNTER — Encounter: Payer: Self-pay | Admitting: Family Medicine

## 2023-11-11 ENCOUNTER — Encounter: Payer: Self-pay | Admitting: Family Medicine

## 2024-05-11 ENCOUNTER — Ambulatory Visit: Admitting: Family Medicine

## 2024-05-12 DIAGNOSIS — L299 Pruritus, unspecified: Secondary | ICD-10-CM | POA: Diagnosis not present

## 2024-05-12 DIAGNOSIS — L820 Inflamed seborrheic keratosis: Secondary | ICD-10-CM | POA: Diagnosis not present

## 2024-05-18 ENCOUNTER — Ambulatory Visit: Admitting: Family Medicine

## 2024-05-24 ENCOUNTER — Encounter: Payer: Self-pay | Admitting: Radiology

## 2024-08-05 ENCOUNTER — Encounter: Payer: Self-pay | Admitting: Family Medicine
